# Patient Record
Sex: Male | Born: 1965 | Hispanic: No | Marital: Married | Smoking: Current every day smoker
Health system: Southern US, Community
[De-identification: ages and names within clinical notes are randomized; demographics above are authoritative.]

## PROBLEM LIST (undated history)

## (undated) DIAGNOSIS — E119 Type 2 diabetes mellitus without complications: Secondary | ICD-10-CM

## (undated) DIAGNOSIS — M1990 Unspecified osteoarthritis, unspecified site: Secondary | ICD-10-CM

## (undated) DIAGNOSIS — I4891 Unspecified atrial fibrillation: Secondary | ICD-10-CM

---

## 2015-09-18 ENCOUNTER — Emergency Department (HOSPITAL_COMMUNITY): Payer: Self-pay

## 2015-09-18 ENCOUNTER — Observation Stay (HOSPITAL_COMMUNITY)
Admission: EM | Admit: 2015-09-18 | Discharge: 2015-09-20 | Disposition: A | Discharge status: Home / Self Care | Payer: Self-pay | Attending: Internal Medicine | Admitting: Internal Medicine

## 2015-09-18 ENCOUNTER — Encounter (HOSPITAL_COMMUNITY): Payer: Self-pay | Admitting: *Deleted

## 2015-09-18 ENCOUNTER — Observation Stay (HOSPITAL_COMMUNITY): Payer: Self-pay

## 2015-09-18 DIAGNOSIS — Z6829 Body mass index (BMI) 29.0-29.9, adult: Secondary | ICD-10-CM | POA: Insufficient documentation

## 2015-09-18 DIAGNOSIS — R531 Weakness: Secondary | ICD-10-CM

## 2015-09-18 DIAGNOSIS — R4781 Slurred speech: Principal | ICD-10-CM | POA: Insufficient documentation

## 2015-09-18 DIAGNOSIS — E119 Type 2 diabetes mellitus without complications: Secondary | ICD-10-CM | POA: Insufficient documentation

## 2015-09-18 DIAGNOSIS — R2990 Unspecified symptoms and signs involving the nervous system: Secondary | ICD-10-CM | POA: Diagnosis present

## 2015-09-18 DIAGNOSIS — I639 Cerebral infarction, unspecified: Secondary | ICD-10-CM

## 2015-09-18 DIAGNOSIS — I10 Essential (primary) hypertension: Secondary | ICD-10-CM | POA: Insufficient documentation

## 2015-09-18 DIAGNOSIS — G451 Carotid artery syndrome (hemispheric): Secondary | ICD-10-CM | POA: Insufficient documentation

## 2015-09-18 DIAGNOSIS — I480 Paroxysmal atrial fibrillation: Secondary | ICD-10-CM | POA: Insufficient documentation

## 2015-09-18 DIAGNOSIS — M4802 Spinal stenosis, cervical region: Secondary | ICD-10-CM | POA: Insufficient documentation

## 2015-09-18 DIAGNOSIS — E669 Obesity, unspecified: Secondary | ICD-10-CM | POA: Insufficient documentation

## 2015-09-18 DIAGNOSIS — I4891 Unspecified atrial fibrillation: Secondary | ICD-10-CM

## 2015-09-18 DIAGNOSIS — R079 Chest pain, unspecified: Secondary | ICD-10-CM | POA: Insufficient documentation

## 2015-09-18 DIAGNOSIS — E785 Hyperlipidemia, unspecified: Secondary | ICD-10-CM | POA: Insufficient documentation

## 2015-09-18 DIAGNOSIS — R200 Anesthesia of skin: Secondary | ICD-10-CM | POA: Insufficient documentation

## 2015-09-18 DIAGNOSIS — F17210 Nicotine dependence, cigarettes, uncomplicated: Secondary | ICD-10-CM | POA: Insufficient documentation

## 2015-09-18 HISTORY — DX: Unspecified atrial fibrillation: I48.91

## 2015-09-18 LAB — DIFFERENTIAL
BASOS ABS: 0.1 10*3/uL (ref 0.0–0.1)
Basophils Relative: 1 %
Eosinophils Absolute: 0.3 10*3/uL (ref 0.0–0.7)
Eosinophils Relative: 3 %
LYMPHS ABS: 4.1 10*3/uL — AB (ref 0.7–4.0)
Lymphocytes Relative: 40 %
MONO ABS: 1.4 10*3/uL — AB (ref 0.1–1.0)
MONOS PCT: 13 %
NEUTROS ABS: 4.6 10*3/uL (ref 1.7–7.7)
Neutrophils Relative %: 43 %

## 2015-09-18 LAB — COMPREHENSIVE METABOLIC PANEL
ALK PHOS: 58 U/L (ref 38–126)
ALT: 25 U/L (ref 17–63)
AST: 24 U/L (ref 15–41)
Albumin: 3.9 g/dL (ref 3.5–5.0)
Anion gap: 9 (ref 5–15)
BILIRUBIN TOTAL: 0.7 mg/dL (ref 0.3–1.2)
BUN: 18 mg/dL (ref 6–20)
CALCIUM: 9.3 mg/dL (ref 8.9–10.3)
CO2: 23 mmol/L (ref 22–32)
CREATININE: 0.86 mg/dL (ref 0.61–1.24)
Chloride: 106 mmol/L (ref 101–111)
GFR calc Af Amer: >60 mL/min (ref >60)
Glucose, Bld: 128 mg/dL — ABNORMAL HIGH (ref 65–99)
Potassium: 4.1 mmol/L (ref 3.5–5.1)
Sodium: 138 mmol/L (ref 135–145)
TOTAL PROTEIN: 7.3 g/dL (ref 6.5–8.1)

## 2015-09-18 LAB — CBC
HEMATOCRIT: 45.2 % (ref 39.0–52.0)
HEMOGLOBIN: 14.3 g/dL (ref 13.0–17.0)
MCH: 25.3 pg — ABNORMAL LOW (ref 26.0–34.0)
MCHC: 31.6 g/dL (ref 30.0–36.0)
MCV: 79.9 fL (ref 78.0–100.0)
Platelets: 179 10*3/uL (ref 150–400)
RBC: 5.66 MIL/uL (ref 4.22–5.81)
RDW: 13.4 % (ref 11.5–15.5)
WBC: 10.4 10*3/uL (ref 4.0–10.5)

## 2015-09-18 LAB — I-STAT TROPONIN, ED: TROPONIN I, POC: 0 ng/mL (ref 0.00–0.08)

## 2015-09-18 LAB — LIPID PANEL
CHOL/HDL RATIO: 6.7 ratio
Cholesterol: 220 mg/dL — ABNORMAL HIGH (ref 0–200)
HDL: 33 mg/dL — AB (ref >40)
LDL CALC: 148 mg/dL — AB (ref 0–99)
Triglycerides: 197 mg/dL — ABNORMAL HIGH (ref <150)
VLDL: 39 mg/dL (ref 0–40)

## 2015-09-18 LAB — I-STAT CHEM 8, ED
BUN: 22 mg/dL — ABNORMAL HIGH (ref 6–20)
CALCIUM ION: 1.14 mmol/L (ref 1.12–1.23)
CREATININE: 0.8 mg/dL (ref 0.61–1.24)
Chloride: 103 mmol/L (ref 101–111)
GLUCOSE: 122 mg/dL — AB (ref 65–99)
HCT: 48 % (ref 39.0–52.0)
HEMOGLOBIN: 16.3 g/dL (ref 13.0–17.0)
POTASSIUM: 4.1 mmol/L (ref 3.5–5.1)
Sodium: 140 mmol/L (ref 135–145)
TCO2: 24 mmol/L (ref 0–100)

## 2015-09-18 LAB — APTT: APTT: 31 s (ref 24–37)

## 2015-09-18 LAB — PROTIME-INR
INR: 1.02 (ref 0.00–1.49)
Prothrombin Time: 13.6 seconds (ref 11.6–15.2)

## 2015-09-18 MED ORDER — FAMOTIDINE 20 MG PO TABS
40.0000 mg | ORAL_TABLET | Freq: Once | ORAL | Status: AC
  Administered 2015-09-18: 40 mg via ORAL
  Filled 2015-09-18: qty 2

## 2015-09-18 MED ORDER — ENOXAPARIN SODIUM 40 MG/0.4ML ~~LOC~~ SOLN
40.0000 mg | SUBCUTANEOUS | Status: DC
  Administered 2015-09-18: 40 mg via SUBCUTANEOUS
  Filled 2015-09-18: qty 0.4

## 2015-09-18 MED ORDER — ROSUVASTATIN CALCIUM 20 MG PO TABS
20.0000 mg | ORAL_TABLET | Freq: Every day | ORAL | Status: DC
  Administered 2015-09-18 – 2015-09-19 (×2): 20 mg via ORAL
  Filled 2015-09-18 (×4): qty 1

## 2015-09-18 MED ORDER — SODIUM CHLORIDE 0.9 % IV BOLUS (SEPSIS)
500.0000 mL | Freq: Once | INTRAVENOUS | Status: AC
  Administered 2015-09-18: 500 mL via INTRAVENOUS

## 2015-09-18 MED ORDER — ACETAMINOPHEN 650 MG RE SUPP: 650.0000 mg | Freq: Four times a day (QID) | RECTAL | Status: DC | PRN

## 2015-09-18 MED ORDER — STROKE: EARLY STAGES OF RECOVERY BOOK
Freq: Once | Status: AC
  Administered 2015-09-18: 17:00:00
  Filled 2015-09-18: qty 1

## 2015-09-18 MED ORDER — ACETAMINOPHEN 325 MG PO TABS: 650.0000 mg | ORAL_TABLET | Freq: Four times a day (QID) | ORAL | Status: DC | PRN

## 2015-09-18 MED ORDER — IOPAMIDOL (ISOVUE-370) INJECTION 76%
INTRAVENOUS | Status: AC
  Administered 2015-09-18: 80 mL
  Filled 2015-09-18: qty 100

## 2015-09-18 MED ORDER — SODIUM CHLORIDE 0.9 % IV SOLN
INTRAVENOUS | Status: DC
  Administered 2015-09-18: 17:00:00 via INTRAVENOUS
  Administered 2015-09-19: 1000 mL via INTRAVENOUS

## 2015-09-18 MED ORDER — HYDROCODONE-ACETAMINOPHEN 5-325 MG PO TABS: 1.0000 | ORAL_TABLET | ORAL | Status: DC | PRN

## 2015-09-18 MED ORDER — SODIUM CHLORIDE 0.9 % IV SOLN: INTRAVENOUS | Status: DC

## 2015-09-18 MED ORDER — SODIUM CHLORIDE 0.9% FLUSH
3.0000 mL | Freq: Two times a day (BID) | INTRAVENOUS | Status: DC
  Administered 2015-09-18 – 2015-09-19 (×2): 3 mL via INTRAVENOUS

## 2015-09-18 MED ORDER — DIPHENHYDRAMINE HCL 25 MG PO CAPS
50.0000 mg | ORAL_CAPSULE | Freq: Four times a day (QID) | ORAL | Status: DC | PRN
  Administered 2015-09-18: 50 mg via ORAL
  Filled 2015-09-18: qty 2

## 2015-09-18 MED ORDER — METHYLPREDNISOLONE SODIUM SUCC 125 MG IJ SOLR
125.0000 mg | Freq: Once | INTRAMUSCULAR | Status: AC
  Administered 2015-09-18: 125 mg via INTRAVENOUS
  Filled 2015-09-18: qty 2

## 2015-09-18 MED ORDER — ONDANSETRON HCL 4 MG/2ML IJ SOLN: 4.0000 mg | Freq: Four times a day (QID) | INTRAMUSCULAR | Status: DC | PRN

## 2015-09-18 MED ORDER — POLYETHYLENE GLYCOL 3350 17 G PO PACK: 17.0000 g | PACK | Freq: Every day | ORAL | Status: DC | PRN

## 2015-09-18 MED ORDER — ONDANSETRON HCL 4 MG PO TABS: 4.0000 mg | ORAL_TABLET | Freq: Four times a day (QID) | ORAL | Status: DC | PRN

## 2015-09-18 MED ORDER — BISACODYL 5 MG PO TBEC: 5.0000 mg | DELAYED_RELEASE_TABLET | Freq: Every day | ORAL | Status: DC | PRN

## 2015-09-18 MED ORDER — ASPIRIN EC 325 MG PO TBEC
325.0000 mg | DELAYED_RELEASE_TABLET | Freq: Once | ORAL | Status: AC
  Administered 2015-09-18: 325 mg via ORAL
  Filled 2015-09-18: qty 1

## 2015-09-18 NOTE — ED Notes (Signed)
PT here for chest pain and slurred speech.  States for past several days L shoulder/chest pressure and irregular HR.  When he woke up this am he felt his L arm was heavy and that his speech was slurred.  States speech improved, however feels his tongue is "fat".

## 2015-09-18 NOTE — ED Notes (Signed)
Report given to 5M RN.  

## 2015-09-18 NOTE — Consult Note (Signed)
Requesting Physician: Dr.  Adela Lank      Reason for consultation:  Acute stroke  HPI:                                                                                                                                         Dustin Long is an 50 y.o. male patient who  was brought into the emergency room for further evaluation of palpitations, left hand weakness and numbness in both upper extremities worse on the right side. He was noted to have atrial fibrillation in the ER. Neurology is consulted for further evaluation of possible acute stroke based on his symptoms.. Denies any vision problems, reported that his speech is been slightly thick. He woke up with these symptoms.   Date last known well:  2017, April  Time last known well:  Last night 10 pm tPA Given: No: outside window  Stroke Risk Factors - atrial fibrillation  Past Medical History: History reviewed. No pertinent past medical history.  History reviewed. No pertinent past surgical history.  Family History: No family history on file.  Social History:   reports that he has been smoking Cigarettes.  He has been smoking about 0.25 packs per day. He does not have any smokeless tobacco history on file. He reports that he drinks alcohol. He reports that he does not use illicit drugs.  Allergies:  No Known Allergies   Medications:                                                                                                                         Current facility-administered medications:  .  iopamidol (ISOVUE-370) 76 % injection, , , ,   Current outpatient prescriptions:  Marland Kitchen  Multiple Vitamins-Minerals (MULTIVITAMIN WITH MINERALS) tablet, Take 1 tablet by mouth daily., Disp: , Rfl:    ROS:  History obtained from the patient  General ROS: negative for - chills, fatigue, fever, night sweats,  weight gain or weight loss Psychological ROS: negative for - behavioral disorder, hallucinations, memory difficulties, mood swings or suicidal ideation Ophthalmic ROS: negative for - blurry vision, double vision, eye pain or loss of vision ENT ROS: negative for - epistaxis, nasal discharge, oral lesions, sore throat, tinnitus or vertigo Allergy and Immunology ROS: negative for - hives or itchy/watery eyes Hematological and Lymphatic ROS: negative for - bleeding problems, bruising or swollen lymph nodes Endocrine ROS: negative for - galactorrhea, hair pattern changes, polydipsia/polyuria or temperature intolerance Respiratory ROS: negative for - cough, hemoptysis, shortness of breath or wheezing Cardiovascular ROS: negative for - chest pain, dyspnea on exertion, edema or irregular heartbeat Gastrointestinal ROS: negative for - abdominal pain, diarrhea, hematemesis, nausea/vomiting or stool incontinence Genito-Urinary ROS: negative for - dysuria, hematuria, incontinence or urinary frequency/urgency Musculoskeletal ROS: negative for - joint swelling or muscular weakness Neurological ROS: as noted in HPI Dermatological ROS: negative for rash and skin lesion changes  Neurologic Examination:                                                                                                    Today's Vitals   09/18/15 0958 09/18/15 1107 09/18/15 1123 09/18/15 1232  BP:   125/82 110/87  Pulse:   93 79  Temp:  98.3 F (36.8 C)    TempSrc:      Resp:   20 15  Height:  (1.88 m)     Weight: 102.059 kg (225 lb)     SpO2:   97% 98%  PainSc:        Evaluation of higher integrative functions including: Level of alertness: Alert,  Oriented to time, place and person Recent and remote memory - intact   Attention span and concentration  - intact   Speech: fluent, no evidence of dysarthria or aphasia noted.  Test the following cranial nerves: 2-12 grossly intact Motor examination: Normal tone,  bulk, full 5/5 motor strength in all 4 extremities Examination of sensation : reduced Sensation to pinprick in the right upper and lower extremities compared to left side    Examination of deep tendon reflexes: 2+, normal and symmetric in all extremities, normal plantars bilaterally Test coordination: Normal finger nose testing, with no evidence of limb appendicular ataxia or abnormal involuntary movements or tremors noted.  Gait:Within normal limits    Lab Results: Basic Metabolic Panel:  Recent Labs Lab 09/18/15 1013 09/18/15 1017  NA 138 140  K 4.1 4.1  CL 106 103  CO2 23  --   GLUCOSE 128* 122*  BUN 18 22*  CREATININE 0.86 0.80  CALCIUM 9.3  --     Liver Function Tests:  Recent Labs Lab 09/18/15 1013  AST 24  ALT 25  ALKPHOS 58  BILITOT 0.7  PROT 7.3  ALBUMIN 3.9   No results for input(s): LIPASE, AMYLASE in the last 168 hours. No results for input(s): AMMONIA in the last 168 hours.  CBC:  Recent Labs Lab 09/18/15 1013  09/18/15 1017  WBC 10.4  --   NEUTROABS 4.6  --   HGB 14.3 16.3  HCT 45.2 48.0  MCV 79.9  --   PLT 179  --     Cardiac Enzymes: No results for input(s): CKTOTAL, CKMB, CKMBINDEX, TROPONINI in the last 168 hours.  Lipid Panel: No results for input(s): CHOL, TRIG, HDL, CHOLHDL, VLDL, LDLCALC in the last 168 hours.  CBG: No results for input(s): GLUCAP in the last 168 hours.  Microbiology: No results found for this or any previous visit.   Imaging: Ct Head Wo Contrast  09/18/2015  CLINICAL DATA:  Slurred speech and left arm numbness EXAM: CT HEAD WITHOUT CONTRAST TECHNIQUE: Contiguous axial images were obtained from the base of the skull through the vertex without intravenous contrast. COMPARISON:  None. FINDINGS: The ventricles are normal in size and configuration. The right lateral ventricle is slightly larger than the left lateral ventricle, an anatomic variant. There is no intracranial mass, hemorrhage, extra-axial fluid  collection, or midline shift. The gray-white compartments are normal. No acute infarct evident. The bony calvarium appears intact. The mastoid air cells are clear. No intraorbital lesions are evident. IMPRESSION: Study within normal limits. Electronically Signed   By: Bretta BangWilliam  Woodruff III M.D.   On: 09/18/2015 11:03     Stroke Scales   NIHSS :  3  [place] Stroke Program Early CT score (ASPECTS) : 10  Pre stroke Modified Rankin Scale (mRS): 0   Assessment and plan:   Renda RollsMohammad Long is an 50 y.o. male patient who presented For further evaluation of new onset of mild slurred speech, left hand weakness and right-sided numbness symptoms. He is noted to have a new onset of atrial fibrillation should in the ER, which is a significant risk factors for embolic strokes. Neurodiagnostic workup in the ER including CT of the head, CT angiogram of the head and neck and MRI of the brain have all been negative for any acute pathology. Possible transient ischemic event or TIA.  Started on aspirin 325 mg. Because of his atrial fibrillation, he would benefit from starting anticoagulation. Defer further discussion about anticoagulation and to initiate the therapy to the neurology stroke team will be following starting tomorrow morning. Further workup with echocardiogram, and will continue telemetry monitoring overnight in the hospital.  Physical, occupational therapy and speech pathology evaluation prior to discharge. Answered several of the patient and family's questions to their satisfaction.  Neurology stroke team will continue to follow-up starting tomorrow morning.

## 2015-09-18 NOTE — ED Notes (Signed)
Pt c/o minor itching after ct scan. Meds ordered  and given

## 2015-09-18 NOTE — Consult Note (Signed)
CARDIOLOGY CONSULT NOTE   Patient ID: Dustin Long MRN: [id number] DOB/AGE: 07-13-1965 50 y.o.  Admit date: 09/18/2015  Primary Physician   No primary care provider on file. Primary Cardiologist   New  Reason for Consultation   Atrial fib  [id number] Ketchum is a 50 y.o. year old male with a history of occasional headaches and tobacco use. He has no known history of HTN, HLD, DM.  He was admitted with possible CVA and atrial fib, cards asked to evaluate. He is able to answer questions pretty well but sometimes he seems confused.   He has a long but very infrequent history of palpitations. His heart will start skipping and racing. He will drink a glass of water and it will stop. This has been happening infrequently for years.   Over the last week or 2, it has happened more often. The episodes were still brief, until today.  Today (several days ago), the skipping and racing started without provocation. He felt a squeezing in his chest and the symptoms went up into his neck. He developed slurred speech and numbness in both arms.  These symptoms had never happened before. He came to the ER when symptoms did not resolve. In the ER, he was in atrial flutter/fib with a HR of 109.   His speech has improved, the chest pain and neck discomfort are gone, and he no longer feels his heart racing or skipping, but the numbness is still there.     He is not sure when he last had a physical. He takes lemon water 2 x week, ginger water 2 x week and cinnamon/clove water as well to keep himself healthy. He lived in [place] for a while and tends to use herbal remedies for ailments. If he feels bad, he will drink a glass of water. He drinks 1 or 2 cups of coffee daily, rare alcohol, smokes 5 or so cigarettes daily. He tries to eat healthy.  Past Medical History  Diagnosis Date  . Atrial fibrillation (HCC) 09/18/2015     History reviewed. No pertinent past surgical history.  No Known Allergies  I  have reviewed the patient's current medications .  stroke: mapping our early stages of recovery book   Does not apply Once  . enoxaparin (LOVENOX) injection  40 mg Subcutaneous Q24H  . rosuvastatin  20 mg Oral q1800  . sodium chloride flush  3 mL Intravenous Q12H   . sodium chloride    . sodium chloride     acetaminophen **OR** acetaminophen, bisacodyl, diphenhydrAMINE, HYDROcodone-acetaminophen, ondansetron **OR** ondansetron (ZOFRAN) IV, polyethylene glycol  Prior to Admission medications   Medication Sig Start Date End Date Taking? Authorizing Provider  Multiple Vitamins-Minerals (MULTIVITAMIN WITH MINERALS) tablet Take 1 tablet by mouth daily.   Yes Historical Provider, MD     Social History   Social History  . Marital Status: Married    Spouse Name: N/A  . Number of Children: N/A  . Years of Education: N/A   Occupational History  . 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 Smoking status: Current Every Day Smoker -- 0.25 packs/day    Types: Cigarettes  . Smokeless tobacco: Not on file  . Alcohol Use: Yes     Comment: occ  . Drug Use: No  . Sexual Activity: Not on file   Other Topics Concern  . Not on file   Social History Narrative   Lives with his wife  Family Status  Relation Status Death Age  . Mother Deceased   . Father Deceased   . Sister Deceased   . Brother Deceased    Family History  Problem Relation Age of Onset  . Heart attack Neg Hx   . Coronary artery disease Neg Hx      ROS:  Full 14 point review of systems complete and found to be negative unless listed above.  Physical Exam: Blood pressure 111/74, pulse 68, temperature 98.3 F (36.8 C), temperature source Oral, resp. rate 13, height 6\' 2"  (1.88 m), weight 225 lb (102.059 kg), SpO2 99 %.  General: Well developed, well nourished, male in no acute distress Head: Eyes PERRLA, No xanthomas.   Normocephalic and atraumatic, oropharynx without edema or exudate. Dentition:  fair Lungs: clear bilaterally Heart: HRRR S1 S2, no rub/gallop, no murmur. pulses are 2+ all 4 extrem.   Neck: No carotid bruits. No lymphadenopathy.  JVD not elevated. Abdomen: Bowel sounds present, abdomen soft and non-tender without masses or hernias noted. Msk:  No spine or cva tenderness. No weakness, no joint deformities or effusions. Extremities: No clubbing or cyanosis. No edema.  Neuro: Alert and oriented X 3. No focal deficits noted. Psych:  Good affect, responds appropriately Skin: No rashes or lesions noted.  Labs:   Lab Results  Component Value Date   WBC 10.4 09/18/2015   HGB 16.3 09/18/2015   HCT 48.0 09/18/2015   MCV 79.9 09/18/2015   PLT 179 09/18/2015    Recent Labs  09/18/15 1013  INR 1.02     Recent Labs Lab 09/18/15 1013 09/18/15 1017  NA 138 140  K 4.1 4.1  CL 106 103  CO2 23  --   BUN 18 22*  CREATININE 0.86 0.80  CALCIUM 9.3  --   PROT 7.3  --   BILITOT 0.7  --   ALKPHOS 58  --   ALT 25  --   AST 24  --   GLUCOSE 128* 122*  ALBUMIN 3.9  --     Recent Labs  09/18/15 1015  TROPIPOC 0.00   Lab Results  Component Value Date   CHOL 220* 09/18/2015   HDL 33* 09/18/2015   LDLCALC 148* 09/18/2015   TRIG 197* 09/18/2015   ECG:  04/21 Atrial flutter vs fib, HR 109  Radiology:  Ct Angio Head and neck W/cm &/or Wo Cm 09/18/2015  CLINICAL DATA:  Left face numbness, slurred speech, decreased grip in left hand. EXAM: CT ANGIOGRAPHY HEAD AND NECK TECHNIQUE: Multidetector CT imaging of the head and neck was performed using the standard protocol during bolus administration of intravenous contrast. Multiplanar CT image reconstructions and MIPs were obtained to evaluate the vascular anatomy. Carotid stenosis measurements (when applicable) are obtained utilizing NASCET criteria, using the distal internal carotid diameter as the denominator. CONTRAST:  80 cc Isovue 370 intravenous COMPARISON:  Head CT from earlier today FINDINGS: CTA NECK Aortic arch:  No aneurysm or dissection.  Three vessel branching Right carotid system: No atheromatous changes. No stenosis, dissection, or vessel beading Left carotid system: No atheromatous change. No stenosis, dissection, or vessel beading. Vertebral arteries:No proximal subclavian stenosis. Codominant vertebral arteries. The right V1 segment is partially obscured by intravenous contrast. Both vertebral arteries are smooth widely patent to the dura . Skeleton: No contributory finding. Multilevel disc degeneration with reversed cervical lordosis. Other neck: Atherosclerotic plaque seen in the distal LAD. CTA HEAD Anterior circulation: Fenestrated anterior communicating artery. Probable small posterior communicating arteries. No  major branch occlusion or stenosis. No aneurysm or beading. Posterior circulation: Symmetric vertebral arteries and vertebrobasilar branching. Both PICAs originates near the dural penetration. No major branch occlusion or stenosis. No aneurysm or beading. Venous sinuses: Patent Anatomic variants: Fenestrated anterior communicating artery Delayed phase: No parenchymal enhancement or mass lesion. IMPRESSION: Negative CTA.  No major branch occlusion or stenosis. Electronically Signed   By: Marnee Spring M.D.   On: 09/18/2015 13:52   Ct Head Wo Contrast 09/18/2015  CLINICAL DATA:  Slurred speech and left arm numbness EXAM: CT HEAD WITHOUT CONTRAST TECHNIQUE: Contiguous axial images were obtained from the base of the skull through the vertex without intravenous contrast. COMPARISON:  None. FINDINGS: The ventricles are normal in size and configuration. The right lateral ventricle is slightly larger than the left lateral ventricle, an anatomic variant. There is no intracranial mass, hemorrhage, extra-axial fluid collection, or midline shift. The gray-white compartments are normal. No acute infarct evident. The bony calvarium appears intact. The mastoid air cells are clear. No intraorbital lesions are  evident. IMPRESSION: Study within normal limits. Electronically Signed   By: Bretta Bang III M.D.   On: 09/18/2015 11:03   Mr Brain Wo Contrast 09/18/2015  CLINICAL DATA:  50 year old male with slurred speech and left upper extremity numbness. Initial encounter. EXAM: MRI HEAD WITHOUT CONTRAST TECHNIQUE: Multiplanar, multiecho pulse sequences of the brain and surrounding structures were obtained without intravenous contrast. COMPARISON:  Head CT without contrast 1055 hours today. CTA head and neck 1309 hours. FINDINGS: Study is mildly degraded by motion artifact despite repeated imaging attempts. No convincing restricted diffusion to suggest acute infarct. Major intracranial vascular flow voids are preserved. No midline shift, mass effect, evidence of mass lesion, ventriculomegaly, extra-axial collection or acute intracranial hemorrhage. Cervicomedullary junction and pituitary are within normal limits. Patchy cerebral white matter T2 and FLAIR hyperintensity in the anterior right frontal lobe near the frontal horn. Mild periventricular T2 and FLAIR hyperintensity along the body of the right lateral ventricle (series 7, image 18). These are nonspecific. No cortical encephalomalacia or chronic cerebral blood products identified. Elsewhere gray and white matter signal is within normal limits. Mastoids are clear. Trace paranasal sinus mucosal thickening. Negative orbit and scalp soft tissues. Chronic C4-C5 disc and endplate degeneration. Normal bone marrow signal. IMPRESSION: 1.  No acute intracranial abnormality. 2. Mild for age nonspecific signal changes in the right frontal lobe white matter. Electronically Signed   By: Odessa Fleming M.D.   On: 09/18/2015 15:09    ASSESSMENT AND PLAN:   The patient was seen today by Dr Excell Seltzer, the patient evaluated and the data reviewed.   Principal Problem:   Stroke-like symptoms - per IM  Active Problems:   Atrial fibrillation (HCC) - unknown duration - CHADS2VASC=2  (TIA) - currently is on DVT Lovenox - MD advise on full anticoagulation either with oral agent or heparin.  - no insurance, may qualify for pt assistance with Eliquis or Xarelto  SignedLeanna Battles 09/18/2015 4:06 PM Beeper [phone]  Co-Sign MD  Patient seen, examined. Available data reviewed. Agree with findings, assessment, and plan as outlined by Theodore Demark, PA-C. Exam reveals alert oriented male in NAD. Lungs CTA, heart RRR without murmur, abdomen soft, obese, extremities without edema.   Tele reviewed and atrial fib was present initially, now spontaneously back in sinus rhythm.   CHADS-Vasc = 2 (if TIA confirmed by neuro), maybe 3 (hx diabetes but he states he was 'cured' with acupuncture. Probably best to anticoagulate  with CHADS-Vasc 2/3. Discussed options with patient and he is agreeable to NOAC drug. Will start on Xarelto. Recommend check 2D Echo and do 48 hour Holter at discharge. He has 'slow heart rate' at home and doesn't want to take beta-blocker so will monitor without AV nodal blocker for now. FU in atrial fib clinic.  Tonny Bollman, M.D. 09/18/2015 7:24 PM

## 2015-09-18 NOTE — Progress Notes (Signed)
Patient admitted to the unit from ER. Patient alert and oriented x 4. Patient oriented to room and made comfortable. Tele placed on patient.

## 2015-09-18 NOTE — H&P (Signed)
History and Physical    Dustin RollsMohammad Long EAV:[id number] DOB: 03/26/1966 DOA: 09/18/2015  Referring MD/NP/PA: Ivar Drapeob Browning, MD - EDP PCP: No primary care provider on file.  Outpatient Specialists: none Patient coming from:  home  Chief Complaint: slurred speech, chest pain, left arm numbness and irregular heart beat.    HPI: Dustin RollsMohammad Long is a 50 y.o. male with no significant medical history who presented to ED this am with slurred speech. For a week patient has been having intermittent irregular heartbeat associated with chest discomfort . Symptoms worse with exertion . Patient complains of bilateral upper extremity numbness associated with discomfort when making a fists. Around 7 AM today patient woke with slurred speech. Currently patient is a little dizzy, the bilateral upper extremity numbness is better but unresolved. Slurred speech has resolved  ED Course: no significant lab abnormalities. No acute abnormalities on head and neck CTA. EDP contacted Neurology - Dr. Lavon PaganiniNandigam who will see the patient  Review of Systems: As per HPI otherwise 10 point review of systems negative.   PMH: none  PSH: none  FMH: no history of CVA, DM or other illness. Parents relatively health.    reports that he has been smoking Cigarettes.  He has been smoking about 0.25 packs per day. He does not have any smokeless tobacco history on file. He reports that he drinks alcohol. He reports that he does not use illicit drugs.  No Known Allergies   Prior to Admission medications   Medication Sig Start Date End Date Taking? Authorizing Provider  Multiple Vitamins-Minerals (MULTIVITAMIN WITH MINERALS) tablet Take 1 tablet by mouth daily.   Yes Historical Provider, MD    Physical Exam: Filed Vitals:   09/18/15 1200 09/18/15 1230 09/18/15 1232 09/18/15 1342  BP: 122/79 110/87 110/87 102/73  Pulse: 73 67 79 72  Temp:      TempSrc:      Resp: 12 14 15 20   Height:      Weight:      SpO2: 99% 98% 98% 99%      Constitutional: NAD, calm, comfortable Filed Vitals:   09/18/15 1200 09/18/15 1230 09/18/15 1232 09/18/15 1342  BP: 122/79 110/87 110/87 102/73  Pulse: 73 67 79 72  Temp:      TempSrc:      Resp: 12 14 15 20   Height:      Weight:      SpO2: 99% 98% 98% 99%   Eyes: PERRL, lids and conjunctivae normal ENMT: Mucous membranes are moist. Posterior pharynx clear of any exudate or lesions..  Neck: normal, supple, no masses Respiratory: clear to auscultation bilaterally, no wheezing, no crackles. Normal respiratory effort. No accessory muscle use.  Cardiovascular: Regular rate and rhythm, no murmurs / rubs / gallops. No extremity edema. 2+ pedal pulses. No carotid bruits.  Abdomen: no tenderness, no masses palpated. No hepatosplenomegaly. Bowel sounds positive.  Musculoskeletal: no clubbing / cyanosis. No joint deformity upper and lower extremities. Good ROM, no contractures. Normal muscle tone.  Skin: no rashes, lesions, ulcers. No induration Neurologic: CN 2-12 grossly intact. Sensation intact, DTR normal. Strength 5/5 in all 4.  Psychiatric: Normal judgment and insight. Alert and oriented x 3. Normal mood.   Labs on Admission: I have personally reviewed following labs and imaging studies  CBC:  Recent Labs Lab 09/18/15 1013 09/18/15 1017  WBC 10.4  --   NEUTROABS 4.6  --   HGB 14.3 16.3  HCT 45.2 48.0  MCV 79.9  --   PLT  179  --    Basic Metabolic Panel:  Recent Labs Lab 09/18/15 1013 09/18/15 1017  NA 138 140  K 4.1 4.1  CL 106 103  CO2 23  --   GLUCOSE 128* 122*  BUN 18 22*  CREATININE 0.86 0.80  CALCIUM 9.3  --    GFR: Estimated Creatinine Clearance: 140.9 mL/min (by C-G formula based on Cr of 0.8). Liver Function Tests:  Recent Labs Lab 09/18/15 1013  AST 24  ALT 25  ALKPHOS 58  BILITOT 0.7  PROT 7.3  ALBUMIN 3.9   Coagulation Profile:  Recent Labs Lab 09/18/15 1013  INR 1.02   Radiological Exams on Admission: Ct Angio Head W/cm &/or  Wo Cm  09/18/2015  CLINICAL DATA:  Left face numbness, slurred speech, decreased grip in left hand. EXAM: CT ANGIOGRAPHY HEAD AND NECK TECHNIQUE: Multidetector CT imaging of the head and neck was performed using the standard protocol during bolus administration of intravenous contrast. Multiplanar CT image reconstructions and MIPs were obtained to evaluate the vascular anatomy. Carotid stenosis measurements (when applicable) are obtained utilizing NASCET criteria, using the distal internal carotid diameter as the denominator. CONTRAST:  80 cc Isovue 370 intravenous COMPARISON:  Head CT from earlier today FINDINGS: CTA NECK Aortic arch: No aneurysm or dissection.  Three vessel branching Right carotid system: No atheromatous changes. No stenosis, dissection, or vessel beading Left carotid system: No atheromatous change. No stenosis, dissection, or vessel beading. Vertebral arteries:No proximal subclavian stenosis. Codominant vertebral arteries. The right V1 segment is partially obscured by intravenous contrast. Both vertebral arteries are smooth widely patent to the dura . Skeleton: No contributory finding. Multilevel disc degeneration with reversed cervical lordosis. Other neck: Atherosclerotic plaque seen in the distal LAD. CTA HEAD Anterior circulation: Fenestrated anterior communicating artery. Probable small posterior communicating arteries. No major branch occlusion or stenosis. No aneurysm or beading. Posterior circulation: Symmetric vertebral arteries and vertebrobasilar branching. Both PICAs originates near the dural penetration. No major branch occlusion or stenosis. No aneurysm or beading. Venous sinuses: Patent Anatomic variants: Fenestrated anterior communicating artery Delayed phase: No parenchymal enhancement or mass lesion. IMPRESSION: Negative CTA.  No major branch occlusion or stenosis. Electronically Signed   By: Marnee Spring M.D.   On: 09/18/2015 13:52   Ct Head Wo Contrast  09/18/2015   CLINICAL DATA:  Slurred speech and left arm numbness EXAM: CT HEAD WITHOUT CONTRAST TECHNIQUE: Contiguous axial images were obtained from the base of the skull through the vertex without intravenous contrast. COMPARISON:  None. FINDINGS: The ventricles are normal in size and configuration. The right lateral ventricle is slightly larger than the left lateral ventricle, an anatomic variant. There is no intracranial mass, hemorrhage, extra-axial fluid collection, or midline shift. The gray-white compartments are normal. No acute infarct evident. The bony calvarium appears intact. The mastoid air cells are clear. No intraorbital lesions are evident. IMPRESSION: Study within normal limits. Electronically Signed   By: Bretta Bang III M.D.   On: 09/18/2015 11:03   Ct Angio Neck W/cm &/or Wo/cm  09/18/2015  CLINICAL DATA:  Left face numbness, slurred speech, decreased grip in left hand. EXAM: CT ANGIOGRAPHY HEAD AND NECK TECHNIQUE: Multidetector CT imaging of the head and neck was performed using the standard protocol during bolus administration of intravenous contrast. Multiplanar CT image reconstructions and MIPs were obtained to evaluate the vascular anatomy. Carotid stenosis measurements (when applicable) are obtained utilizing NASCET criteria, using the distal internal carotid diameter as the denominator. CONTRAST:  80 cc  Isovue 370 intravenous COMPARISON:  Head CT from earlier today FINDINGS: CTA NECK Aortic arch: No aneurysm or dissection.  Three vessel branching Right carotid system: No atheromatous changes. No stenosis, dissection, or vessel beading Left carotid system: No atheromatous change. No stenosis, dissection, or vessel beading. Vertebral arteries:No proximal subclavian stenosis. Codominant vertebral arteries. The right V1 segment is partially obscured by intravenous contrast. Both vertebral arteries are smooth widely patent to the dura . Skeleton: No contributory finding. Multilevel disc  degeneration with reversed cervical lordosis. Other neck: Atherosclerotic plaque seen in the distal LAD. CTA HEAD Anterior circulation: Fenestrated anterior communicating artery. Probable small posterior communicating arteries. No major branch occlusion or stenosis. No aneurysm or beading. Posterior circulation: Symmetric vertebral arteries and vertebrobasilar branching. Both PICAs originates near the dural penetration. No major branch occlusion or stenosis. No aneurysm or beading. Venous sinuses: Patent Anatomic variants: Fenestrated anterior communicating artery Delayed phase: No parenchymal enhancement or mass lesion. IMPRESSION: Negative CTA.  No major branch occlusion or stenosis. Electronically Signed   By: Marnee Spring M.D.   On: 09/18/2015 13:52    EKG: Independently reviewed.  EKG Interpretation  Date/Time:  Friday September 18 2015 09:52:02 EDT Ventricular Rate:  109 PR Interval:    QRS Duration: 76 QT Interval:  326 QTC Calculation: 439 R Axis:   90 Text Interpretation:  Atrial fibrillation with rapid ventricular response Rightward axis Abnormal ECG No old tracing to compare Confirmed by FLOYD MD, DANIEL [phone]) on 09/18/2015 9:58:46 AM Also confirmed by Adela Lank MD, DANIEL [phone]), editor [place] CT, Jola Babinski [phone])  on 09/18/2015 10:37:08 AM        Assessment/Plan   Stroke-like symptoms in setting of new onset atrial fibrillation. CT angiogram head and neck unremarkable. Slurred speech resolved. Probable TIA. Neurology evaluating.  -  Place in Observation /Telemetry  -  Initially q2h neuro checks, then q4h neuro checks -  Bubble  ECHO already ordered -  Lipid panel  (done)  -  A1c -  PT/OT -  RN to perform bedside swallowing evaluation and advance diet if study normal.  -  Neurology assistance appreciated -  Antiplatelet recommendations to be made by Neurology.  -  Brain MRI ordered by Neurology  Atrial fibrillation, new onset. Chadsvasc.0. Rate 108 -monitor on  Telemetry -Cardiology consult placed for new atrial fibrillation  Tobacco abuse.  -RN to provide smoking cessation education  Hyperlipidemia.  -Start Crestor  daily    Lab Results  Component Value Date   CHOL 220* 09/18/2015   HDL 33* 09/18/2015   LDLCALC 148* 09/18/2015   TRIG 197* 09/18/2015   CHOLHDL 6.7 09/18/2015    DVT prophylaxis: Lovenox Code Status:  Full code Family Communication: Discussed with the patient Disposition Plan: home in 24-48 hours Consults called: Neurology - Dr. Lavon Paganini Admission status:  Observation / telemetry   Willette Cluster MD [facility] Pager [phone]  If 7PM-7AM, please contact night-coverage [email or web address] Password [facility]  09/18/2015, 2:04 PM

## 2015-09-18 NOTE — ED Notes (Signed)
Dr. Nandigm at bedside.  

## 2015-09-18 NOTE — ED Provider Notes (Signed)
CSN: [id number]     Arrival date & time 09/18/15  0944 History   First MD Initiated Contact with Patient 09/18/15 1002     Chief Complaint  Patient presents with  . Aphasia  . Chest Pain     (Consider location/radiation/quality/duration/timing/severity/associated sxs/prior Treatment) HPI Comments: Patient presents to the emergency department with chief complaint of slurred speech and left arm numbness sensation. He states that the symptoms were present when he awoke this morning. Last normal night before bed. Patient also reports that he has had some heart palpitations. He has never felt this before in the past. He has no medical problems. He does not take any medications. Reports mild improvement of slurred speech, but still has heaviness sensation in left arm.  The history is provided by the patient. No language interpreter was used.    History reviewed. No pertinent past medical history. History reviewed. No pertinent past surgical history. No family history on file. Social History  Substance Use Topics  . Smoking status: Current Every Day Smoker -- 0.25 packs/day    Types: Cigarettes  . Smokeless tobacco: None  . Alcohol Use: Yes     Comment: occ    Review of Systems  Constitutional: Negative for fever and chills.  Respiratory: Negative for shortness of breath.   Cardiovascular: Positive for palpitations. Negative for chest pain.  Gastrointestinal: Negative for nausea, vomiting, diarrhea and constipation.  Genitourinary: Negative for dysuria.  Neurological: Positive for speech difficulty.  All other systems reviewed and are negative.     Allergies  Review of patient's allergies indicates no known allergies.  Home Medications   Prior to Admission medications   Not on File   BP 159/87 mmHg  Pulse 73  Temp(Src) 98.3 F (36.8 C) (Oral)  Resp 14  Ht  (1.88 m)  Wt 102.059 kg  BMI 28.88 kg/m2  SpO2 98% Physical Exam  Constitutional: He is oriented to  person, place, and time. He appears well-developed and well-nourished.  HENT:  Head: Normocephalic and atraumatic.  Eyes: Conjunctivae and EOM are normal. Pupils are equal, round, and reactive to light. Right eye exhibits no discharge. Left eye exhibits no discharge. No scleral icterus.  Neck: Normal range of motion. Neck supple. No JVD present.  Cardiovascular: Regular rhythm and normal heart sounds.  Exam reveals no gallop and no friction rub.   No murmur heard. Irregularly irregular, mildly tachycardic  Pulmonary/Chest: Effort normal and breath sounds normal. No respiratory distress. He has no wheezes. He has no rales. He exhibits no tenderness.  Abdominal: Soft. He exhibits no distension and no mass. There is no tenderness. There is no rebound and no guarding.  Musculoskeletal: Normal range of motion. He exhibits no edema or tenderness.  CN 3-12 intact Speech seems clear now Sensation and strength 5/5 objectively throughout Normal finger to nose No pronator drift  Neurological: He is alert and oriented to person, place, and time.  Skin: Skin is warm and dry.  Psychiatric: He has a normal mood and affect. His behavior is normal. Judgment and thought content normal.  Nursing note and vitals reviewed.   ED Course  Procedures (including critical care time) Labs Review Labs Reviewed  CBC - Abnormal; Notable for the following:    MCH 25.3 (*)    All other components within normal limits  DIFFERENTIAL - Abnormal; Notable for the following:    Lymphs Abs 4.1 (*)    Monocytes Absolute 1.4 (*)    All other components within normal  limits  I-STAT CHEM 8, ED - Abnormal; Notable for the following:    BUN 22 (*)    Glucose, Bld 122 (*)    All other components within normal limits  PROTIME-INR  APTT  COMPREHENSIVE METABOLIC PANEL  I-STAT TROPOININ, ED  CBG MONITORING, ED    Imaging Review No results found. I have personally reviewed and evaluated these images and lab results as  part of my medical decision-making.   EKG Interpretation   Date/Time:  Friday September 18 2015 09:52:02 EDT Ventricular Rate:  109 PR Interval:    QRS Duration: 76 QT Interval:  326 QTC Calculation: 439 R Axis:   90 Text Interpretation:  Atrial fibrillation with rapid ventricular response  Rightward axis Abnormal ECG No old tracing to compare Confirmed by FLOYD  MD, DANIEL [phone]) on 09/18/2015 9:58:46 AM      MDM   Final diagnoses:  Slurred speech  New onset a-fib (HCC)  Stroke-like symptoms    Patient with stroke like symptoms and new onset afib.  Currently rate controlled.  Discussed with Dr. Adela LankFloyd, who agrees with plan for stroke workup and admission.  Currently neurovascular intact.  No TPA given length of symptoms.  Patient discussed with Dr. Lavon PaganiniNandigam, who recommends CT angiogram of head and cervical spine. He will consult on the patient.  12:53 PM Patient seen by and discussed with Dr. Lavon PaganiniNandigam, who agrees that patient likely had a stroke.  CTAs pending. Dispo per CTAs.  If nothing requiring IR then Hospitalist admission.  2:12 PM CT angio is negative.    Admit to hospitalist service.  Neurology will follow.  Afib is rate controlled.  Roxy Horsemanobert Brandis Matsuura, PA-C 09/18/15 1413  Melene Planan Floyd, DO 09/18/15 1414

## 2015-09-19 ENCOUNTER — Observation Stay (HOSPITAL_BASED_OUTPATIENT_CLINIC_OR_DEPARTMENT_OTHER): Payer: Self-pay

## 2015-09-19 ENCOUNTER — Observation Stay (HOSPITAL_COMMUNITY): Payer: Self-pay

## 2015-09-19 DIAGNOSIS — Z720 Tobacco use: Secondary | ICD-10-CM

## 2015-09-19 DIAGNOSIS — R2990 Unspecified symptoms and signs involving the nervous system: Secondary | ICD-10-CM

## 2015-09-19 DIAGNOSIS — G451 Carotid artery syndrome (hemispheric): Secondary | ICD-10-CM

## 2015-09-19 DIAGNOSIS — E669 Obesity, unspecified: Secondary | ICD-10-CM

## 2015-09-19 DIAGNOSIS — I6789 Other cerebrovascular disease: Secondary | ICD-10-CM

## 2015-09-19 DIAGNOSIS — M4802 Spinal stenosis, cervical region: Secondary | ICD-10-CM | POA: Insufficient documentation

## 2015-09-19 DIAGNOSIS — E785 Hyperlipidemia, unspecified: Secondary | ICD-10-CM | POA: Insufficient documentation

## 2015-09-19 LAB — CBC
HEMATOCRIT: 40.7 % (ref 39.0–52.0)
Hemoglobin: 12.7 g/dL — ABNORMAL LOW (ref 13.0–17.0)
MCH: 25 pg — ABNORMAL LOW (ref 26.0–34.0)
MCHC: 31.2 g/dL (ref 30.0–36.0)
MCV: 80.1 fL (ref 78.0–100.0)
Platelets: 194 10*3/uL (ref 150–400)
RBC: 5.08 MIL/uL (ref 4.22–5.81)
RDW: 13.4 % (ref 11.5–15.5)
WBC: 9.8 10*3/uL (ref 4.0–10.5)

## 2015-09-19 LAB — BASIC METABOLIC PANEL
Anion gap: 12 (ref 5–15)
BUN: 14 mg/dL (ref 6–20)
CALCIUM: 8.8 mg/dL — AB (ref 8.9–10.3)
CO2: 21 mmol/L — ABNORMAL LOW (ref 22–32)
Chloride: 103 mmol/L (ref 101–111)
Creatinine, Ser: 0.93 mg/dL (ref 0.61–1.24)
GFR calc Af Amer: >60 mL/min (ref >60)
GLUCOSE: 236 mg/dL — AB (ref 65–99)
POTASSIUM: 4.5 mmol/L (ref 3.5–5.1)
Sodium: 136 mmol/L (ref 135–145)

## 2015-09-19 LAB — HEMOGLOBIN A1C
HEMOGLOBIN A1C: 6.9 % — AB (ref 4.8–5.6)
MEAN PLASMA GLUCOSE: 151 mg/dL

## 2015-09-19 MED ORDER — RIVAROXABAN 20 MG PO TABS
20.0000 mg | ORAL_TABLET | Freq: Every day | ORAL | Status: DC
  Administered 2015-09-19: 20 mg via ORAL
  Filled 2015-09-19: qty 1

## 2015-09-19 NOTE — Progress Notes (Signed)
ANTICOAGULATION CONSULT NOTE - Initial Consult  Pharmacy Consult for xarelto  Indication: atrial fibrillation  No Known Allergies  Patient Measurements: Height: 6\' 2"  (188 cm) Weight: 231 lb 1.8 oz (104.831 kg) IBW/kg (Calculated) : 82.2  Vital Signs: Temp: 98.4 F (36.9 C) (04/22 1316) Temp Source: Oral (04/22 1316) BP: 117/64 mmHg (04/22 1316) Pulse Rate: 74 (04/22 1316)  Labs:  Recent Labs  09/18/15 1013 09/18/15 1017 09/19/15 0310  HGB 14.3 16.3 12.7*  HCT 45.2 48.0 40.7  PLT 179  --  194  APTT 31  --   --   LABPROT 13.6  --   --   INR 1.02  --   --   CREATININE 0.86 0.80 0.93    Estimated Creatinine Clearance: 122.6 mL/min (by C-G formula based on Cr of 0.93).   Medical History: Past Medical History  Diagnosis Date  . Atrial fibrillation (HCC) 09/18/2015    Assessment: [age] yo presenting with new onset a fib. Seems like been having symptoms for years   Was on DVT prop. Enoxaparin  Plan:  Xarelto 20 mg daily with supper D/C enoxaparin  Monitor for s/sx of bleeding  Isaac BlissMichael Raidyn Wassink, PharmD, BCPS, [facility]CCCP Clinical Pharmacist Pager [phone] 09/19/2015 2:05 PM

## 2015-09-19 NOTE — Evaluation (Signed)
Physical Therapy Evaluation Patient Details Name: Dustin RollsMohammad Edgren MRN: [id number] DOB: 10/25/1965 Today's Date: 09/19/2015   History of Present Illness  Pt is a 50 y.o. male admitted for stroke-like symptoms. He presented with slurred speech, mild dizziness and BUE pain/numbness. All symptoms have resolved except BUE pain. He has no significant medical history.  Clinical Impression  Pt is independent with all functional mobility and demo no balance deficits. Strength is symmetrical and 5/5 bilaterally. He did express concerns of his symptoms originating from the cervical spine. No further PT services indicated. PT signing off.    Follow Up Recommendations No PT follow up    Equipment Recommendations  None recommended by PT    Recommendations for Other Services       Precautions / Restrictions Precautions Precautions: None      Mobility  Bed Mobility Overal bed mobility: Independent                Transfers Overall transfer level: Independent Equipment used: None                Ambulation/Gait Ambulation/Gait assistance: Independent Ambulation Distance (Feet): 1000 Feet Assistive device: None Gait Pattern/deviations: WFL(Within Functional Limits)   Gait velocity interpretation: at or above normal speed for age/gender    Stairs            Wheelchair Mobility    Modified Rankin (Stroke Patients Only)       Balance Overall balance assessment: Independent                               Standardized Balance Assessment Standardized Balance Assessment : Dynamic Gait Index   Dynamic Gait Index Level Surface: Normal Change in Gait Speed: Normal Gait with Horizontal Head Turns: Normal Gait with Vertical Head Turns: Normal Gait and Pivot Turn: Normal Step Over Obstacle: Normal Step Around Obstacles: Normal Steps: Mild Impairment Total Score: 23       Pertinent Vitals/Pain Pain Assessment: Faces Pain Location: "My hands hurt when  I try to pick things up." Pain Intervention(s): Monitored during session    Home Living Family/patient expects to be discharged to:: Private residence Living Arrangements: Spouse/significant other Available Help at Discharge: Family;Available 24 hours/day Type of Home: Apartment Home Access: Stairs to enter Entrance Stairs-Rails: Right Entrance Stairs-Number of Steps: 4 Home Layout: One level Home Equipment: None      Prior Function Level of Independence: Independent               Hand Dominance        Extremity/Trunk Assessment   Upper Extremity Assessment: Overall WFL for tasks assessed (strength is symmetrical with sensation intact)           Lower Extremity Assessment: Overall WFL for tasks assessed      Cervical / Trunk Assessment: Normal  Communication   Communication: No difficulties  Cognition Arousal/Alertness: Awake/alert Behavior During Therapy: WFL for tasks assessed/performed Overall Cognitive Status: Within Functional Limits for tasks assessed                      General Comments      Exercises        Assessment/Plan    PT Assessment Patent does not need any further PT services  PT Diagnosis Difficulty walking;Acute pain   PT Problem List    PT Treatment Interventions     PT Goals (Current goals can be found in the  Care Plan section) Acute Rehab PT Goals Patient Stated Goal: figure out what is going on PT Goal Formulation: All assessment and education complete, DC therapy    Frequency     Barriers to discharge        Co-evaluation               End of Session   Activity Tolerance: Patient tolerated treatment well Patient left: in chair;with call bell/phone within reach Nurse Communication: Mobility status    Functional Assessment Tool Used: clinical judgement Functional Limitation: Mobility: Walking and moving around Mobility: Walking and Moving Around Current Status [phone]): 0 percent impaired, limited  or restricted Mobility: Walking and Moving Around Goal Status [phone]): 0 percent impaired, limited or restricted Mobility: Walking and Moving Around Discharge Status [phone]): 0 percent impaired, limited or restricted    Time: 9147-8295 PT Time Calculation (min) (ACUTE ONLY): 21 min   Charges:   PT Evaluation $PT Eval Moderate Complexity: 1 Procedure     PT G Codes:   PT G-Codes **NOT FOR INPATIENT CLASS** Functional Assessment Tool Used: clinical judgement Functional Limitation: Mobility: Walking and moving around Mobility: Walking and Moving Around Current Status (A2130): 0 percent impaired, limited or restricted Mobility: Walking and Moving Around Goal Status (Q6578): 0 percent impaired, limited or restricted Mobility: Walking and Moving Around Discharge Status [phone]): 0 percent impaired, limited or restricted    Ilda Foil 09/19/2015, 12:56 PM

## 2015-09-19 NOTE — Progress Notes (Signed)
Called case manager to notify about patient's consult.

## 2015-09-19 NOTE — Progress Notes (Signed)
STROKE TEAM PROGRESS NOTE   HISTORY OF PRESENT ILLNESS Dustin Long is an 50 y.o. male patient who was brought into the emergency room for further evaluation of palpitations, left hand weakness and numbness in both upper extremities worse on the right side. He was noted to have atrial fibrillation in the ER. Neurology is consulted for further evaluation of possible acute stroke based on his symptoms.. Denies any vision problems, reported that his speech is been slightly thick. He woke up with these symptoms.   Date last known well: 2017, April  Time last known well: Last night 10 pm tPA Given: No: outside window   SUBJECTIVE (INTERVAL HISTORY) The patient's wife and daughter were at the bedside. He has been experiencing neck discomfort and occasional dizziness when turning his head. He still complains of some numbness and weakness of his b/l upper extremities, but much better than yesterday. MRI brain negative for stroke. Will do MRI of the cervical spine.   OBJECTIVE Temp:  [97.6 F (36.4 C)-98.3 F (36.8 C)] 97.6 F (36.4 C) (04/22 0400) Pulse Rate:  [59-97] 92 (04/22 0400) Cardiac Rhythm:  [-] Sinus tachycardia (04/21 1902) Resp:  [12-20] 15 (04/22 0400) BP: (102-159)/(56-87) 120/56 mmHg (04/22 0400) SpO2:  [95 %-99 %] 98 % (04/22 0400) Weight:  [102.059 kg (225 lb)-104.831 kg (231 lb 1.8 oz)] 104.831 kg (231 lb 1.8 oz) (04/21 1600)  CBC:  Recent Labs Lab 09/18/15 1013 09/18/15 1017 09/19/15 0310  WBC 10.4  --  9.8  NEUTROABS 4.6  --   --   HGB 14.3 16.3 12.7*  HCT 45.2 48.0 40.7  MCV 79.9  --  80.1  PLT 179  --  194    Basic Metabolic Panel:  Recent Labs Lab 09/18/15 1013 09/18/15 1017 09/19/15 0310  NA 138 140 136  K 4.1 4.1 4.5  CL 106 103 103  CO2 23  --  21*  GLUCOSE 128* 122* 236*  BUN 18 22* 14  CREATININE 0.86 0.80 0.93  CALCIUM 9.3  --  8.8*    Lipid Panel:    Component Value Date/Time   CHOL 220* 09/18/2015 1402   TRIG 197* 09/18/2015 1402    HDL 33* 09/18/2015 1402   CHOLHDL 6.7 09/18/2015 1402   VLDL 39 09/18/2015 1402   LDLCALC 148* 09/18/2015 1402   HgbA1c:  Lab Results  Component Value Date   HGBA1C 6.9* 09/18/2015   Urine Drug Screen: No results found for: LABOPIA, COCAINSCRNUR, LABBENZ, AMPHETMU, THCU, LABBARB    IMAGING I have personally reviewed the radiological images below and agree with the radiology interpretations.  Ct Angio Head and Neck W/cm &/or Wo Cm 09/18/2015   Negative CTA.  No major branch occlusion or stenosis.   Ct Head Wo Contrast 09/18/2015   Study within normal limits.   Mr Brain Wo Contrast 09/18/2015   1.  No acute intracranial abnormality.  2. Mild for age nonspecific signal changes in the right frontal lobe white matter.   MRI of the cervical spine 09/19/2015 1. Fairly advanced chronic cervical disc and endplate degeneration from C4-C5 to C6-C7. Spinal stenosis at each level with mild to moderate spinal cord mass effect most pronounced at C5-C6. No cord signal abnormality associated. 2. Moderate bilateral C5 and up to moderate right C6 neural foraminal stenosis. Mild bilateral C7 foraminal stenosis.  TTE - - Left ventricle: The cavity size was normal. There was moderate  concentric hypertrophy. Systolic function was vigorous. The  estimated ejection fraction was in  the range of 65% to 70%. Wall  motion was normal; there were no regional wall motion  abnormalities. Features are consistent with a pseudonormal left  ventricular filling pattern, with concomitant abnormal relaxation  and increased filling pressure (grade 2 diastolic dysfunction).  There was no evidence of elevated ventricular filling pressure by  Doppler parameters. - Aortic valve: Structurally normal valve. There was no  regurgitation. - Mitral valve: Structurally normal valve. There was no  regurgitation. - Left atrium: The atrium was mildly dilated. - Right ventricle: The cavity size was normal. Wall  thickness was  normal. Systolic function was normal. - Right atrium: The atrium was normal in size. - Tricuspid valve: There was mild regurgitation. - Pericardium, extracardiac: The pericardium was normal in  appearance.   PHYSICAL EXAM  Temp:  [97.6 F (36.4 C)-98.4 F (36.9 C)] 98.2 F (36.8 C) (04/22 1741) Pulse Rate:  [73-97] 73 (04/22 1741) Resp:  [15-18] 18 (04/22 1741) BP: (113-128)/(56-72) 126/72 mmHg (04/22 1741) SpO2:  [95 %-99 %] 97 % (04/22 1741)  General - Well nourished, well developed, in no apparent distress.  Ophthalmologic - Sharp disc margins OU.   Cardiovascular - Regular rate and rhythm, not in afib rhythm.  Mental Status -  Level of arousal and orientation to time, place, and person were intact. Language including expression, naming, repetition, comprehension was assessed and found intact. Fund of Knowledge was assessed and was intact.  Cranial Nerves II - XII - II - Visual field intact OU. III, IV, VI - Extraocular movements intact. V - Facial sensation intact bilaterally. VII - Facial movement intact bilaterally. VIII - Hearing & vestibular intact bilaterally. X - Palate elevates symmetrically. XI - Chin turning & shoulder shrug intact bilaterally. XII - Tongue protrusion intact.  Motor Strength - The patient's strength was normal in all extremities and pronator drift was absent.  Bulk was normal and fasciculations were absent.   Motor Tone - Muscle tone was assessed at the neck and appendages and was normal.  Reflexes - The patient's reflexes were 1+ in all extremities and he had no pathological reflexes.  Sensory - Light touch, temperature/pinprick were assessed and were symmetrical, but subjective feeling of both palm tingling.    Coordination - The patient had normal movements in the hands and feet with no ataxia or dysmetria.  Tremor was absent.  Gait and Station - deferred to PT in room   ASSESSMENT/PLAN Mr. Dustin Long is a 50  y.o. male with history of ongoing tobacco use presenting with palpitations, speech difficulties, left hand weakness, and numbness of both upper extremities. found to have afib. He did not receive IV t-PA due to late presentation.  Possible right brain TIA  Resultant subjective b/l hand tingling  MRI No acute intracranial abnormality.   CTA of the head and neck - negative  MRI C spine - mild to moderate spinal cord mass effect most pronounced at C5-C6. No cord signal abnormality associated.  2D Echo - EF 65-70%. No cardiac source of emboli identified.  LDL - 148  HgbA1c 6.9  VTE prophylaxis - Xarelto  Diet Heart Room service appropriate?: Yes; Fluid consistency:: Thin  No antithrombotic prior to admission, now on Xarelto (rivaroxaban) daily.   Patient counseled to be compliant with his antithrombotic medications  Ongoing aggressive stroke risk factor management  Therapy recommendations: No follow-up therapy recommended.  Disposition: Pending  Newly diagnosed afib  Cardiology on board  Recommend Xarelto for stroke prevention  Continue Xarelto on discharge  Cervical spinal stenosis  MRI C-spine showed mild to moderate spinal cord mass effect most pronounced at C5-C6. No cord signal abnormality associated.  No surgical indication at this time  Will need outpt follow up with NSG  Also recommend outpt EMG study to evaluate radiculopathy  Consider outpt PT/OT   Hypertension  Stable  Hyperlipidemia  Home meds: No lipid lowering medications prior to admission  LDL 148, goal < 70  Now on Crestor 20 mg daily  Continue statin at discharge  Pre-diabetes  HgbA1c 6.9, goal < 7.0  Uncontrolled  outpt PCP close follow up  Tobacco abuse  Current smoker  Smoking cessation counseling provided  Pt is willing to quit  Other Stroke Risk Factors  ETOH use  Obesity, Body mass index is 29.66 kg/(m^2).   Other Active Problems    Hospital day #  1  Marvel PlanJindong Zane Pellecchia, MD PhD Stroke Neurology 09/19/2015 6:57 PM   To contact Stroke Continuity provider, please refer to [email or web address]. After hours, contact General Neurology

## 2015-09-19 NOTE — Progress Notes (Signed)
  Echocardiogram 2D Echocardiogram has been performed.  Dustin Long, Dustin Long 09/19/2015, 1:42 PM

## 2015-09-19 NOTE — Discharge Instructions (Signed)

## 2015-09-19 NOTE — Care Management Note (Signed)
Case Management Note  Patient Details  Name: Dustin Long MRN: [id number] Date of Birth: 05/07/1966  Subjective/Objective:                  CVA  Action/Plan: CM spoke with patient at the bedside. Provided patient with a Xarelto 30 day free card. Encouraged to call the telephone number in the Xarelto pamphlet on Monday to request to enroll in Patient Assistance Program. Prov[facility] and [facility] for primary care and referrals to specialists. Patient verbalizes understanding of the above and denies having any questions.   Expected Discharge Date:    09/20/15              Expected Discharge Plan:  Home/Self Care  In-House Referral:     Discharge planning Services  CM Consult, Medication Assistance, Indigent Health Clinic  Post Acute Care Choice:    Choice offered to:     DME Arranged:  N/A DME Agency:  NA  HH Arranged:  NA HH Agency:  NA  Status of Service:  Completed, signed off  Medicare Important Message Given:    Date Medicare IM Given:    Medicare IM give by:    Date Additional Medicare IM Given:    Additional Medicare Important Message give by:     If discussed at Long Length of Stay Meetings, dates discussed:    Additional Comments:  Antony HasteBennett, Divante Kotch Harris, RN 09/19/2015, 4:18 PM

## 2015-09-19 NOTE — Progress Notes (Signed)
Paged Diabetes coordinator to notify about patient's consult. Awaiting response.

## 2015-09-19 NOTE — Progress Notes (Signed)
OT Cancellation Note  Patient Details Name: Dustin Long MRN: [id number] DOB: 10/20/1965   Cancelled Treatment:    Reason Eval/Treat Not Completed: OT screened, no needs identified, will sign off.  Pt with negative MRI, and per PT, pt has returned to baseline.   Angelene GiovanniConarpe, Elianny Buxbaum M  Shaquera Ansley Miamionarpe, OTR/L [phone]  09/19/2015, 12:11 PM

## 2015-09-19 NOTE — Progress Notes (Signed)
PROGRESS NOTE  Dustin RollsMohammad Panas ZOX:[id number] DOB: 12/13/1965 DOA: 09/18/2015 PCP: No primary care provider on file.  HPI/Recap of past 24 hours:  Reported persistent left hand numbness, mri no acute stroke Denies chest pain, no sob, no palpitation Wife and daughter in room  Assessment/Plan: Principal Problem:   Stroke-like symptoms Active Problems:   Atrial fibrillation (HCC)   New onset a-fib (HCC)   Hemispheric carotid artery syndrome   Slurred speech  TIA: mri brain no cva, echo with bubble pending, a1c 6.9, ldl 148, started  On statin, xarelto per cardiology recommendations. Neurology following,   Left arm numbness and neck pain, MRI neck pending.  Paroxysmal Afib, with intermittent bradycardia, cardiology recommended xarelto for now, holter monitor at discharge and afib clinic follow up  HLD; ldl 148, started statin  Diabetes:  new diagnosis. a1c 6.9, fasting blood glucose 236 this am. Patient reported with history of elevated blood glucose was treated with acupuncture with great effect in the past, he reported his father has "old age diabetes" Diabetes education, will need testing supplies at discharge, will start metformin at discharge  Obesity: life style changes  Smoker: smoking cessation education  Care manager consulted for assistance with meds, follow up appointment, patient does not have insurance, no pmd.  Code Status: full  Family Communication: patient and wife in room  Disposition Plan: home , likely 4/23   Consultants:  Neurology  cardiology  Procedures:  none  Antibiotics:  none   Objective: BP 117/64 mmHg  Pulse 74  Temp(Src) 98.4 F (36.9 C) (Oral)  Resp 18  Ht 6\' 2"  (1.88 m)  Wt 104.831 kg (231 lb 1.8 oz)  BMI 29.66 kg/m2  SpO2 95%  Intake/Output Summary (Last 24 hours) at 09/19/15 1425 Last data filed at 09/18/15 2105  Gross per 24 hour  Intake    243 ml  Output      0 ml  Net    243 ml   Filed Weights   09/18/15  0958 09/18/15 1600  Weight: 102.059 kg (225 lb) 104.831 kg (231 lb 1.8 oz)    Exam:   General:  NAD  Cardiovascular: RRR  Respiratory: CTABL  Abdomen: Soft/ND/NT, positive BS  Musculoskeletal: No Edema  Neuro: aaox3, no focal weakness  Data Reviewed: Basic Metabolic Panel:  Recent Labs Lab 09/18/15 1013 09/18/15 1017 09/19/15 0310  NA 138 140 136  K 4.1 4.1 4.5  CL 106 103 103  CO2 23  --  21*  GLUCOSE 128* 122* 236*  BUN 18 22* 14  CREATININE 0.86 0.80 0.93  CALCIUM 9.3  --  8.8*   Liver Function Tests:  Recent Labs Lab 09/18/15 1013  AST 24  ALT 25  ALKPHOS 58  BILITOT 0.7  PROT 7.3  ALBUMIN 3.9   No results for input(s): LIPASE, AMYLASE in the last 168 hours. No results for input(s): AMMONIA in the last 168 hours. CBC:  Recent Labs Lab 09/18/15 1013 09/18/15 1017 09/19/15 0310  WBC 10.4  --  9.8  NEUTROABS 4.6  --   --   HGB 14.3 16.3 12.7*  HCT 45.2 48.0 40.7  MCV 79.9  --  80.1  PLT 179  --  194   Cardiac Enzymes:   No results for input(s): CKTOTAL, CKMB, CKMBINDEX, TROPONINI in the last 168 hours. BNP (last 3 results) No results for input(s): BNP in the last 8760 hours.  ProBNP (last 3 results) No results for input(s): PROBNP in the last 8760 hours.  CBG: No results for input(s): GLUCAP in the last 168 hours.  No results found for this or any previous visit (from the past 240 hour(s)).   Studies: Mr Sherrin Daisy Contrast  09/18/2015  CLINICAL DATA:  50 year old male with slurred speech and left upper extremity numbness. Initial encounter. EXAM: MRI HEAD WITHOUT CONTRAST TECHNIQUE: Multiplanar, multiecho pulse sequences of the brain and surrounding structures were obtained without intravenous contrast. COMPARISON:  Head CT without contrast 1055 hours today. CTA head and neck 1309 hours. FINDINGS: Study is mildly degraded by motion artifact despite repeated imaging attempts. No convincing restricted diffusion to suggest acute infarct.  Major intracranial vascular flow voids are preserved. No midline shift, mass effect, evidence of mass lesion, ventriculomegaly, extra-axial collection or acute intracranial hemorrhage. Cervicomedullary junction and pituitary are within normal limits. Patchy cerebral white matter T2 and FLAIR hyperintensity in the anterior right frontal lobe near the frontal horn. Mild periventricular T2 and FLAIR hyperintensity along the body of the right lateral ventricle (series 7, image 18). These are nonspecific. No cortical encephalomalacia or chronic cerebral blood products identified. Elsewhere gray and white matter signal is within normal limits. Mastoids are clear. Trace paranasal sinus mucosal thickening. Negative orbit and scalp soft tissues. Chronic C4-C5 disc and endplate degeneration. Normal bone marrow signal. IMPRESSION: 1.  No acute intracranial abnormality. 2. Mild for age nonspecific signal changes in the right frontal lobe white matter. Electronically Signed   By: Odessa Fleming M.D.   On: 09/18/2015 15:09    Scheduled Meds: . rivaroxaban  20 mg Oral Q supper  . rosuvastatin  20 mg Oral q1800  . sodium chloride flush  3 mL Intravenous Q12H    Continuous Infusions: . sodium chloride    . sodium chloride 1,000 mL (09/19/15 0415)     Time spent:  Thanvi Blincoe MD, PhD  [facility] Pager [phone]. If 7PM-7AM, please contact night-coverage at [email or web address], password [facility] 09/19/2015, 2:25 PM

## 2015-09-20 ENCOUNTER — Other Ambulatory Visit: Payer: Self-pay | Admitting: Neurology

## 2015-09-20 DIAGNOSIS — E119 Type 2 diabetes mellitus without complications: Secondary | ICD-10-CM

## 2015-09-20 DIAGNOSIS — R2990 Unspecified symptoms and signs involving the nervous system: Secondary | ICD-10-CM

## 2015-09-20 DIAGNOSIS — I4891 Unspecified atrial fibrillation: Secondary | ICD-10-CM

## 2015-09-20 LAB — CBC
HCT: 38 % — ABNORMAL LOW (ref 39.0–52.0)
HEMOGLOBIN: 12 g/dL — AB (ref 13.0–17.0)
MCH: 25.4 pg — ABNORMAL LOW (ref 26.0–34.0)
MCHC: 31.6 g/dL (ref 30.0–36.0)
MCV: 80.3 fL (ref 78.0–100.0)
Platelets: 164 10*3/uL (ref 150–400)
RBC: 4.73 MIL/uL (ref 4.22–5.81)
RDW: 13.6 % (ref 11.5–15.5)
WBC: 10.7 10*3/uL — AB (ref 4.0–10.5)

## 2015-09-20 LAB — BASIC METABOLIC PANEL
ANION GAP: 10 (ref 5–15)
BUN: 12 mg/dL (ref 6–20)
CHLORIDE: 104 mmol/L (ref 101–111)
CO2: 26 mmol/L (ref 22–32)
Calcium: 8.7 mg/dL — ABNORMAL LOW (ref 8.9–10.3)
Creatinine, Ser: 0.8 mg/dL (ref 0.61–1.24)
GFR calc non Af Amer: >60 mL/min (ref >60)
Glucose, Bld: 107 mg/dL — ABNORMAL HIGH (ref 65–99)
POTASSIUM: 3.9 mmol/L (ref 3.5–5.1)
SODIUM: 140 mmol/L (ref 135–145)

## 2015-09-20 LAB — MAGNESIUM: MAGNESIUM: 1.8 mg/dL (ref 1.7–2.4)

## 2015-09-20 LAB — TSH: TSH: 2.997 u[IU]/mL (ref 0.350–4.500)

## 2015-09-20 MED ORDER — BLOOD GLUCOSE MONITOR KIT: PACK | Status: AC

## 2015-09-20 MED ORDER — RIVAROXABAN 20 MG PO TABS: 20.0000 mg | ORAL_TABLET | Freq: Every day | ORAL | Status: AC

## 2015-09-20 MED ORDER — METFORMIN HCL 500 MG PO TABS: 500.0000 mg | ORAL_TABLET | Freq: Two times a day (BID) | ORAL | Status: AC

## 2015-09-20 MED ORDER — LIVING WELL WITH DIABETES BOOK
Freq: Once | Status: AC
  Administered 2015-09-20: 11:00:00
  Filled 2015-09-20: qty 1

## 2015-09-20 MED ORDER — ROSUVASTATIN CALCIUM 20 MG PO TABS: 20.0000 mg | ORAL_TABLET | Freq: Every day | ORAL | Status: AC

## 2015-09-20 NOTE — Progress Notes (Signed)
Discharge instructions received. RN discussed discharge instructions with patient including follow up appointments to cardiology r/t afib, f/u with neurosurgery for stenosis, and f/u with pcp. Discussed discharge medications including xarelto, crestor, and metformin. Patient vocalized understanding of indication, dosage, frequency, and s/sx to report to md for each medication. RN also discussed blood sugar monitoring, patient vocalized understanding of use of monitor, indication, and logging blood sugars. Patient stated he did not want to be on medications for the rest of his life, rn stressed importance of continuing medications, patient should not stop taking medications without physician instruction. Patient vocalized understanding. Neuro assessment unchanged, patient denies pain. Prescriptions and discharge instructions given to patient. Tele removed. Patient waiting for ride home.

## 2015-09-20 NOTE — Progress Notes (Signed)
RN also discussed discharge instructions with patient's wife. Patient's wife expressed concern over payments for doctor's office visits, case management was made aware. Per dr. Janeece Fittingf xu, patient to have outpatient pt/ot. Case management aware and going to educate patient/give map for outpatient pt/ot

## 2015-09-20 NOTE — Care Management (Signed)
[facility] fax[facility]. Patient provided with telephone number and map to the location. Physicist, medicalCrystal Ephraim Reichel RN BSN CCM

## 2015-09-20 NOTE — Progress Notes (Signed)
STROKE TEAM PROGRESS NOTE   SUBJECTIVE (INTERVAL HISTORY) No family members present. Dr. Roda Shutters discussed the findings from the cervical MRI. He recommended outpatient neurological follow-up with an EMG and Nerve Conduction Study as well as a referral to a neurosurgeon.   OBJECTIVE Temp:  [97.6 F (36.4 C)-98.4 F (36.9 C)] 97.6 F (36.4 C) (04/23 1610) Pulse Rate:  [67-80] 70 (04/23 9604) Cardiac Rhythm:  [-] Normal sinus rhythm (04/22 2000) Resp:  [16-20] 20 (04/23 5409) BP: (115-126)/(64-73) 115/73 mmHg (04/23 0638) SpO2:  [94 %-99 %] 95 % (04/23 0638)  CBC:  Recent Labs Lab 09/18/15 1013  09/19/15 0310 09/20/15 0545  WBC 10.4  --  9.8 10.7*  NEUTROABS 4.6  --   --   --   HGB 14.3  < > 12.7* 12.0*  HCT 45.2  < > 40.7 38.0*  MCV 79.9  --  80.1 80.3  PLT 179  --  194 164  < > = values in this interval not displayed.  Basic Metabolic Panel:   Recent Labs Lab 09/19/15 0310 09/20/15 0545  NA 136 140  K 4.5 3.9  CL 103 104  CO2 21* 26  GLUCOSE 236* 107*  BUN 14 12  CREATININE 0.93 0.80  CALCIUM 8.8* 8.7*  MG  --  1.8    Lipid Panel:     Component Value Date/Time   CHOL 220* 09/18/2015 1402   TRIG 197* 09/18/2015 1402   HDL 33* 09/18/2015 1402   CHOLHDL 6.7 09/18/2015 1402   VLDL 39 09/18/2015 1402   LDLCALC 148* 09/18/2015 1402   HgbA1c:  Lab Results  Component Value Date   HGBA1C 6.9* 09/18/2015   Urine Drug Screen: No results found for: LABOPIA, COCAINSCRNUR, LABBENZ, AMPHETMU, THCU, LABBARB    IMAGING I have personally reviewed the radiological images below and agree with the radiology interpretations.  Ct Angio Head and Neck W/cm &/or Wo Cm 09/18/2015   Negative CTA.  No major branch occlusion or stenosis.   Ct Head Wo Contrast 09/18/2015   Study within normal limits.   Mr Brain Wo Contrast 09/18/2015   1.  No acute intracranial abnormality.  2. Mild for age nonspecific signal changes in the right frontal lobe white matter.   MRI of the  cervical spine 09/19/2015 1. Fairly advanced chronic cervical disc and endplate degeneration from C4-C5 to C6-C7. Spinal stenosis at each level with mild to moderate spinal cord mass effect most pronounced at C5-C6. No cord signal abnormality associated. 2. Moderate bilateral C5 and up to moderate right C6 neural foraminal stenosis. Mild bilateral C7 foraminal stenosis.  TTE - - Left ventricle: The cavity size was normal. There was moderate  concentric hypertrophy. Systolic function was vigorous. The  estimated ejection fraction was in the range of 65% to 70%. Wall  motion was normal; there were no regional wall motion  abnormalities. Features are consistent with a pseudonormal left  ventricular filling pattern, with concomitant abnormal relaxation  and increased filling pressure (grade 2 diastolic dysfunction).  There was no evidence of elevated ventricular filling pressure by  Doppler parameters. - Aortic valve: Structurally normal valve. There was no  regurgitation. - Mitral valve: Structurally normal valve. There was no  regurgitation. - Left atrium: The atrium was mildly dilated. - Right ventricle: The cavity size was normal. Wall thickness was  normal. Systolic function was normal. - Right atrium: The atrium was normal in size. - Tricuspid valve: There was mild regurgitation. - Pericardium, extracardiac: The pericardium was  normal in  appearance.   PHYSICAL EXAM  Temp:  [97.6 F (36.4 C)-98.4 F (36.9 C)] 97.6 F (36.4 C) (04/23 96040638) Pulse Rate:  [67-80] 70 (04/23 0638) Resp:  [16-20] 20 (04/23 54090638) BP: (115-126)/(64-73) 115/73 mmHg (04/23 81190638) SpO2:  [94 %-99 %] 95 % (04/23 14780638)  General - Well nourished, well developed, in no apparent distress.  Ophthalmologic - Sharp disc margins OU.   Cardiovascular - Regular rate and rhythm, not in afib rhythm.  Mental Status -  Level of arousal and orientation to time, place, and person were intact. Language  including expression, naming, repetition, comprehension was assessed and found intact. Fund of Knowledge was assessed and was intact.  Cranial Nerves II - XII - II - Visual field intact OU. III, IV, VI - Extraocular movements intact. V - Facial sensation intact bilaterally. VII - Facial movement intact bilaterally. VIII - Hearing & vestibular intact bilaterally. X - Palate elevates symmetrically. XI - Chin turning & shoulder shrug intact bilaterally. XII - Tongue protrusion intact.  Motor Strength - The patient's strength was normal in all extremities and pronator drift was absent.  Bulk was normal and fasciculations were absent.   Motor Tone - Muscle tone was assessed at the neck and appendages and was normal.  Reflexes - The patient's reflexes were 1+ in all extremities and he had no pathological reflexes.  Sensory - Light touch, temperature/pinprick were assessed and were symmetrical, but subjective feeling of both palm tingling.    Coordination - The patient had normal movements in the hands and feet with no ataxia or dysmetria.  Tremor was absent.  Gait and Station - deferred to PT in room   ASSESSMENT/PLAN Mr. Dustin Long is a 50 y.o. male with history of ongoing tobacco use presenting with palpitations, speech difficulties, left hand weakness, and numbness of both upper extremities. found to have afib. He did not receive IV t-PA due to late presentation.  Possible right brain TIA  Resultant subjective b/l hand tingling  MRI No acute intracranial abnormality.   CTA of the head and neck - negative  MRI C spine - mild to moderate spinal cord mass effect most pronounced at C5-C6. No cord signal abnormality associated.  2D Echo - EF 65-70%. No cardiac source of emboli identified.  LDL - 148  HgbA1c 6.9  VTE prophylaxis - Xarelto Diet heart healthy/carb modified Room service appropriate?: Yes; Fluid consistency:: Thin  No antithrombotic prior to admission, now on  Xarelto (rivaroxaban) daily.   Patient counseled to be compliant with his antithrombotic medications  Ongoing aggressive stroke risk factor management  Therapy recommendations: No follow-up therapy recommended.  Disposition: Pending  Newly diagnosed afib  Cardiology on board  Recommend Xarelto for stroke prevention  Continue Xarelto on discharge  Cervical spinal stenosis  MRI C-spine showed mild to moderate spinal cord mass effect most pronounced at C5-C6. No cord signal abnormality associated.  No surgical indication at this time  Will need outpt follow up with NSG  Also recommend outpt EMG study to evaluate radiculopathy  Consider outpt PT/OT   Hypertension  Stable  Hyperlipidemia  Home meds: No lipid lowering medications prior to admission  LDL 148, goal < 70  Now on Crestor 20 mg daily  Continue statin at discharge  Pre-diabetes  HgbA1c 6.9, goal < 7.0  Uncontrolled  outpt PCP close follow up  Tobacco abuse  Current smoker  Smoking cessation counseling provided  Pt is willing to quit  Other Stroke Risk Factors  ETOH use  Obesity, Body mass index is 29.66 kg/(m^2).   Other Active Problems    Hospital day # 1  Neurology will sign off. Please call with questions. Pt will follow up with Darrol Angel NP at [facility] in about 2 months and consider EMG/NCS. Thanks for the consult.  Marvel Plan, MD PhD Stroke Neurology 09/20/2015 8:40 AM   To contact Stroke Continuity provider, please refer to [email or web address]. After hours, contact General Neurology

## 2015-09-20 NOTE — Discharge Summary (Signed)
Discharge Summary  Dustin Long HYQ:[id number] DOB: [birth year]  PCP: No primary care provider on file.  Admit date: 09/18/2015 Discharge date: 09/20/2015  Time spent: <43mns  Recommendations for Outpatient Follow-up:  1. F/u with PMD within a week  for hospital discharge follow up, repeat cbc/bmp at follow up, pmd to continue monitor blood sugar control 2. F/u with cardiology for paroxysmal afib/bradycardia, 48hr holter monitor recommended by cardiology 3. F/u with neurosurgery for cervical spinal stenosis 4. F/u with neurology for possible TIA  Discharge Diagnoses:  Active Hospital Problems   Diagnosis Date Noted  . Stroke-like symptoms 09/18/2015  . HLD (hyperlipidemia)   . Spinal stenosis in cervical region   . [facility] 09/18/2015  . [place]fib ([facility]   . Hemispheric carotid artery syndrome   . Slurred speech     Resolved Hospital Problems   Diagnosis Date Noted Date Resolved  No resolved problems to display.    Discharge Condition: stable  Diet recommendation: heart healthy/carb modified  Filed Weights   09/18/15 0958 09/18/15 1600  Weight: 102.059 kg (225 lb) 104.831 kg (231 lb 1.8 oz)    History of present illness:  Dustin Lauferis a 50y.o. male with no significant medical history who presented to ED this am with slurred speech. For a week patient has been having intermittent irregular heartbeat associated with chest discomfort . Symptoms worse with exertion . Patient complains of bilateral upper extremity numbness associated with discomfort when making a fists. Around 7 AM today patient woke with slurred speech. Currently patient is a little dizzy, the bilateral upper extremity numbness is better but unresolved. Slurred speech has resolved  ED Course: no significant lab abnormalities. No acute abnormalities on head and neck CTA. EDP contacted Neurology - Dr. NSilverio Decampwho will see the patient  Hospital Course:  Principal Problem:  Stroke-like symptoms Active Problems:   [facility]rial [facility] ([facility]   [place] [facility]   Hemispheric carotid artery syndrome   Slurred speech   HLD (hyperlipidemia)   Spinal stenosis in cervical region  TIA: mri brain no cva, echo with bubble unremarkable, detail please refer to original report, a1c 6.9, ldl 148, started On statin, xarelto per cardiology recommendations. Neurology following,   Left arm numbness and neck pain, MRI neck with cervical spinal stenosis, outpatient PT/OT referral and neurosurgery follow up.  Paroxysmal Afib, with intermittent bradycardia, cardiology recommended xarelto for now, holter monitor at discharge and afib clinic follow up  HLD; ldl 148, started statin  noninsulin dependent Diabetes:  new diagnosis. a1c 6.9, fasting blood glucose 236 this am. Patient reported with history of elevated blood glucose was treated with acupuncture with great effect in the past, he reported his father has "old age diabetes" Diabetes education,  Glucometer/testing supplies/ metformin prescribed at discharge  Obesity: life style changes  Smoker: smoking cessation education  Care manager consulted for assistance with meds, equipment needs, follow up appointment, patient does not have insurance, no pmd.  Code Status: full  Family Communication: patient and wife in room  Disposition Plan: home on 4/23   Consultants:  Neurology  cardiology  Procedures:  none  Antibiotics:  none  Discharge Exam: BP 115/73 mmHg  Pulse 70  Temp(Src) 97.6 F (36.4 C) (Oral)  Resp 20  Ht 6' 2"  (1.88 m)  Wt 104.831 kg (231 lb 1.8 oz)  BMI 29.66 kg/m2  SpO2 95%    General: NAD  Cardiovascular: RRR  Respiratory: CTABL  Abdomen: Soft/ND/NT, positive BS  Musculoskeletal:  No Edema  Neuro: aaox3, no focal weakness   Discharge Instructions You were cared for by a hospitalist during your hospital stay. If you have any questions about your discharge  medications or the care you received while you were in the hospital after you are discharged, you can call the unit and asked to speak with the hospitalist on call if the hospitalist that took care of you is not available. Once you are discharged, your primary care physician will handle any further medical issues. Please note that NO REFILLS for any discharge medications will be authorized once you are discharged, as it is imperative that you return to your primary care physician (or establish a relationship with a primary care physician if you do not have one) for your aftercare needs so that they can reassess your need for medications and monitor your lab values.      Discharge Instructions    Ambulatory referral to Occupational Therapy    Complete by:  As directed   Cervical spinal stenosis     Ambulatory referral to Physical Therapy    Complete by:  As directed   Cervical spinal stenosis,     Diet - low sodium heart healthy    Complete by:  As directed   Carb modified.     For home use only DME Glucometer    Complete by:  As directed      Increase activity slowly    Complete by:  As directed             Medication List    TAKE these medications        blood glucose meter kit and supplies Kit  Dispense based on patient and insurance preference. Use up to four times daily as directed. (FOR ICD-9 250.00, 250.01).     metFORMIN 500 MG tablet  Commonly known as:  GLUCOPHAGE  Take 1 tablet (500 mg total) by mouth 2 (two) times daily with a meal.     multivitamin with minerals tablet  Take 1 tablet by mouth daily.     rivaroxaban 20 MG Tabs tablet  Commonly known as:  XARELTO  Take 1 tablet (20 mg total) by mouth daily with supper.     rosuvastatin 20 MG tablet  Commonly known as:  CRESTOR  Take 1 tablet (20 mg total) by mouth daily at 6 PM.       No Known Allergies Follow-up Information    Follow up with Sherren Mocha, MD In 1 week.   Specialty:  Cardiology   Why:   afib   Contact information:   [address] [place] [address] [phone]       Follow up with Ophelia Charter, MD.   Specialty:  Neurosurgery   Why:  cervical spinal stenosis   Contact information:   [address] [place] [address] [place] [address] [phone]       Follow up with [facility] In 1 week.   Why:  hospital discharge follow up, repeat cbc/bmp at follow up, monitor blood sugar control   Contact information:   [address] [place] [address] [phone]       The results of significant diagnostics from this hospitalization (including imaging, microbiology, ancillary and laboratory) are listed below for reference.    Significant Diagnostic Studies: Ct Angio Head W/cm &/or Wo Cm  09/18/2015  CLINICAL DATA:  Left face numbness, slurred speech, decreased grip in left  hand. EXAM: CT ANGIOGRAPHY HEAD AND NECK TECHNIQUE: Multidetector CT imaging of the head and neck was performed using the standard protocol during bolus administration of intravenous contrast. Multiplanar CT image reconstructions and MIPs were obtained to evaluate the vascular anatomy. Carotid stenosis measurements (when applicable) are obtained utilizing NASCET criteria, using the distal internal carotid diameter as the denominator. CONTRAST:  80 cc Isovue 370 intravenous COMPARISON:  Head CT from earlier today FINDINGS: CTA NECK Aortic arch: No aneurysm or dissection.  Three vessel branching Right carotid system: No atheromatous changes. No stenosis, dissection, or vessel beading Left carotid system: No atheromatous change. No stenosis, dissection, or vessel beading. Vertebral arteries:No proximal subclavian stenosis. Codominant vertebral arteries. The right V1 segment is partially obscured by intravenous contrast. Both vertebral arteries are smooth widely patent to the dura . Skeleton: No contributory finding. Multilevel disc  degeneration with reversed cervical lordosis. Other neck: Atherosclerotic plaque seen in the distal LAD. CTA HEAD Anterior circulation: Fenestrated anterior communicating artery. Probable small posterior communicating arteries. No major branch occlusion or stenosis. No aneurysm or beading. Posterior circulation: Symmetric vertebral arteries and vertebrobasilar branching. Both PICAs originates near the dural penetration. No major branch occlusion or stenosis. No aneurysm or beading. Venous sinuses: Patent Anatomic variants: Fenestrated anterior communicating artery Delayed phase: No parenchymal enhancement or mass lesion. IMPRESSION: Negative CTA.  No major branch occlusion or stenosis. Electronically Signed   By: Monte Fantasia M.D.   On: 09/18/2015 13:52   Ct Head Wo Contrast  09/18/2015  CLINICAL DATA:  Slurred speech and left arm numbness EXAM: CT HEAD WITHOUT CONTRAST TECHNIQUE: Contiguous axial images were obtained from the base of the skull through the vertex without intravenous contrast. COMPARISON:  None. FINDINGS: The ventricles are normal in size and configuration. The right lateral ventricle is slightly larger than the left lateral ventricle, an anatomic variant. There is no intracranial mass, hemorrhage, extra-axial fluid collection, or midline shift. The gray-white compartments are normal. No acute infarct evident. The bony calvarium appears intact. The mastoid air cells are clear. No intraorbital lesions are evident. IMPRESSION: Study within normal limits. Electronically Signed   By: Lowella Grip III M.D.   On: 09/18/2015 11:03   Ct Angio Neck W/cm &/or Wo/cm  09/18/2015  CLINICAL DATA:  Left face numbness, slurred speech, decreased grip in left hand. EXAM: CT ANGIOGRAPHY HEAD AND NECK TECHNIQUE: Multidetector CT imaging of the head and neck was performed using the standard protocol during bolus administration of intravenous contrast. Multiplanar CT image reconstructions and MIPs were  obtained to evaluate the vascular anatomy. Carotid stenosis measurements (when applicable) are obtained utilizing NASCET criteria, using the distal internal carotid diameter as the denominator. CONTRAST:  80 cc Isovue 370 intravenous COMPARISON:  Head CT from earlier today FINDINGS: CTA NECK Aortic arch: No aneurysm or dissection.  Three vessel branching Right carotid system: No atheromatous changes. No stenosis, dissection, or vessel beading Left carotid system: No atheromatous change. No stenosis, dissection, or vessel beading. Vertebral arteries:No proximal subclavian stenosis. Codominant vertebral arteries. The right V1 segment is partially obscured by intravenous contrast. Both vertebral arteries are smooth widely patent to the dura . Skeleton: No contributory finding. Multilevel disc degeneration with reversed cervical lordosis. Other neck: Atherosclerotic plaque seen in the distal LAD. CTA HEAD Anterior circulation: Fenestrated anterior communicating artery. Probable small posterior communicating arteries. No major branch occlusion or stenosis. No aneurysm or beading. Posterior circulation: Symmetric vertebral arteries and vertebrobasilar branching. Both PICAs originates near the dural penetration. No major branch  occlusion or stenosis. No aneurysm or beading. Venous sinuses: Patent Anatomic variants: Fenestrated anterior communicating artery Delayed phase: No parenchymal enhancement or mass lesion. IMPRESSION: Negative CTA.  No major branch occlusion or stenosis. Electronically Signed   By: Monte Fantasia M.D.   On: 09/18/2015 13:52   Mr Brain Wo Contrast  09/18/2015  CLINICAL DATA:  50 year old male with slurred speech and left upper extremity numbness. Initial encounter. EXAM: MRI HEAD WITHOUT CONTRAST TECHNIQUE: Multiplanar, multiecho pulse sequences of the brain and surrounding structures were obtained without intravenous contrast. COMPARISON:  Head CT without contrast 1055 hours today. CTA head and  neck 1309 hours. FINDINGS: Study is mildly degraded by motion artifact despite repeated imaging attempts. No convincing restricted diffusion to suggest acute infarct. Major intracranial vascular flow voids are preserved. No midline shift, mass effect, evidence of mass lesion, ventriculomegaly, extra-axial collection or acute intracranial hemorrhage. Cervicomedullary junction and pituitary are within normal limits. Patchy cerebral white matter T2 and FLAIR hyperintensity in the anterior right frontal lobe near the frontal horn. Mild periventricular T2 and FLAIR hyperintensity along the body of the right lateral ventricle (series 7, image 18). These are nonspecific. No cortical encephalomalacia or chronic cerebral blood products identified. Elsewhere gray and white matter signal is within normal limits. Mastoids are clear. Trace paranasal sinus mucosal thickening. Negative orbit and scalp soft tissues. Chronic C4-C5 disc and endplate degeneration. Normal bone marrow signal. IMPRESSION: 1.  No acute intracranial abnormality. 2. Mild for age nonspecific signal changes in the right frontal lobe white matter. Electronically Signed   By: Genevie Ann M.D.   On: 09/18/2015 15:09   Mr Cervical Spine Wo Contrast  09/19/2015  CLINICAL DATA:  50 year old male with cervical neck pain and left arm numbness, weakness. Initial encounter. EXAM: MRI CERVICAL SPINE WITHOUT CONTRAST TECHNIQUE: Multiplanar, multisequence MR imaging of the cervical spine was performed. No intravenous contrast was administered. COMPARISON:  Brain MRI from 1436 hours today. CTA head and neck 09/18/2015. FINDINGS: Reversal of cervical lordosis as demonstrated on yesterday CTA. No marrow edema or evidence of acute osseous abnormality. Multilevel chronic disc and endplate degeneration. Gas and fluid containing posterior C5 endplate Schmorl node, series 5, image 5. Cervicomedullary junction is within normal limits. Despite multilevel cervical spinal stenosis, no  spinal cord signal abnormality is identified. Negative paraspinal soft tissues. C2-C3:  Negative. C3-C4:  Mild mostly anterior disc bulge.  No stenosis. C4-C5: Disc space loss. Right eccentric circumferential disc osteophyte complex with broad-based posterior component of disc (series 6, image 12). Mild ligament flavum hypertrophy. Spinal stenosis with mild mass effect on the spinal cord. Uncovertebral hypertrophy. Moderate bilateral C5 foraminal stenosis. C5-C6: Disc space loss with circumferential disc osteophyte complex. Broad-based posterior component (series 6, image 17). Mild ligament flavum hypertrophy. Spinal stenosis with mild to moderate spinal cord mass effect. Uncovertebral hypertrophy primarily on the right with mild to moderate right C6 foraminal stenosis. C6-C7: Disc space loss with circumferential disc osteophyte complex. Broad-based posterior component of disc (series 6, image 20). There may also be some cephalad extension of disc in the midline. Mild ligament flavum hypertrophy. Spinal stenosis with no definite spinal cord mass effect. Uncovertebral hypertrophy and disc resulting in mild bilateral C7 foraminal stenosis. C7-T1:  Mild uncovertebral and facet hypertrophy.  No stenosis. T1-T2 small central disc protrusion without stenosis. No upper thoracic spinal stenosis identified. IMPRESSION: 1. Fairly advanced chronic cervical disc and endplate degeneration from C4-C5 to C6-C7. Spinal stenosis at each level with mild to moderate spinal cord mass  effect most pronounced at C5-C6. No cord signal abnormality associated. 2. Moderate bilateral C5 and up to moderate right C6 neural foraminal stenosis. Mild bilateral C7 foraminal stenosis. Electronically Signed   By: Genevie Ann M.D.   On: 09/19/2015 15:56    Microbiology: No results found for this or any previous visit (from the past 240 hour(s)).   Labs: Basic Metabolic Panel:  Recent Labs Lab 09/18/15 1013 09/18/15 1017 09/19/15 0310  09/20/15 0545  NA 138 140 136 140  K 4.1 4.1 4.5 3.9  CL 106 103 103 104  CO2 23  --  21* 26  GLUCOSE 128* 122* 236* 107*  BUN 18 22* 14 12  CREATININE 0.86 0.80 0.93 0.80  CALCIUM 9.3  --  8.8* 8.7*  MG  --   --   --  1.8   Liver Function Tests:  Recent Labs Lab 09/18/15 1013  AST 24  ALT 25  ALKPHOS 58  BILITOT 0.7  PROT 7.3  ALBUMIN 3.9   No results for input(s): LIPASE, AMYLASE in the last 168 hours. No results for input(s): AMMONIA in the last 168 hours. CBC:  Recent Labs Lab 09/18/15 1013 09/18/15 1017 09/19/15 0310 09/20/15 0545  WBC 10.4  --  9.8 10.7*  NEUTROABS 4.6  --   --   --   HGB 14.3 16.3 12.7* 12.0*  HCT 45.2 48.0 40.7 38.0*  MCV 79.9  --  80.1 80.3  PLT 179  --  194 164   Cardiac Enzymes: No results for input(s): CKTOTAL, CKMB, CKMBINDEX, TROPONINI in the last 168 hours. BNP: BNP (last 3 results) No results for input(s): BNP in the last 8760 hours.  ProBNP (last 3 results) No results for input(s): PROBNP in the last 8760 hours.  CBG: No results for input(s): GLUCAP in the last 168 hours.     SignedFlorencia Reasons MD, PhD  [facility] 09/20/2015, 9:08 AM

## 2015-09-20 NOTE — Progress Notes (Signed)
Inpatient Diabetes Program Recommendations  AACE/ADA: New Consensus Statement on Inpatient Glycemic Control (2015)  Target Ranges:  Prepandial:   less than 140 mg/dL      Peak postprandial:   less than 180 mg/dL (1-2 hours)      Critically ill patients:  140 - 180 mg/dL   Review of Glycemic Control  Diabetes history: Pre-diabetes Outpatient Diabetes medications: None Current orders for Inpatient glycemic control: To be discharged on metformin 500 mg bid  HgbA1C - 6.2% To f/u with [facility] for glycemic control. Ordered Living Well With Diabetes book and pt to view diabetes videos on pt ed channel. MD ordered glucose meter and supplies at discharge.  Spoke with RN regarding pt viewing diabetes videos. RN states pt is motivated to control blood sugars at home. [facility]  Thank you. Dustin Long, RD, LDN, CDE Inpatient Diabetes Coordinator [phone]

## 2015-09-21 ENCOUNTER — Other Ambulatory Visit: Payer: Self-pay | Admitting: Cardiology

## 2015-09-21 DIAGNOSIS — G459 Transient cerebral ischemic attack, unspecified: Secondary | ICD-10-CM

## 2015-09-30 ENCOUNTER — Inpatient Hospital Stay (HOSPITAL_COMMUNITY): Admission: RE | Admit: 2015-09-30 | Payer: Self-pay | Source: Ambulatory Visit | Admitting: Nurse Practitioner

## 2015-12-15 ENCOUNTER — Ambulatory Visit: Payer: Self-pay | Admitting: Nurse Practitioner

## 2017-06-01 IMAGING — MR MR HEAD W/O CM
9 of 10 series · 34 of 48 positions shown · non-contrast
Comparison: Head CT without contrast 8288 hours today. CTA head and
neck 7944 hours.

CLINICAL DATA: 50-year-old male with slurred speech and left upper
extremity numbness. Initial encounter.

EXAM:
MRI HEAD WITHOUT CONTRAST
TECHNIQUE: Multiplanar, multiecho pulse sequences of the brain and surrounding
structures were obtained without intravenous contrast.

[Series 2: FLAIR · sagittal · 5.0mm · 0.47mm/px · 2 of 23 slices shown (1 of 2)]
[im 1/23]
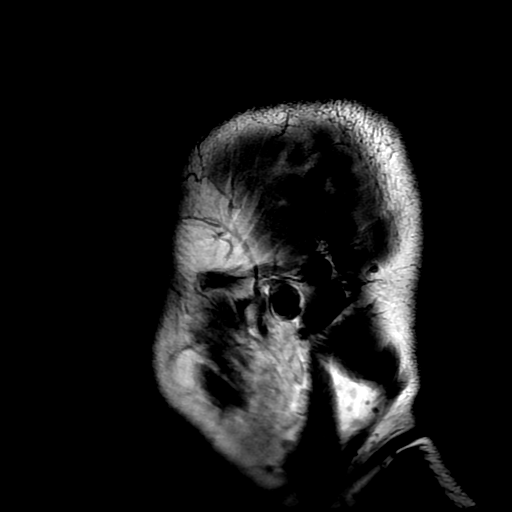
[im 23/23]
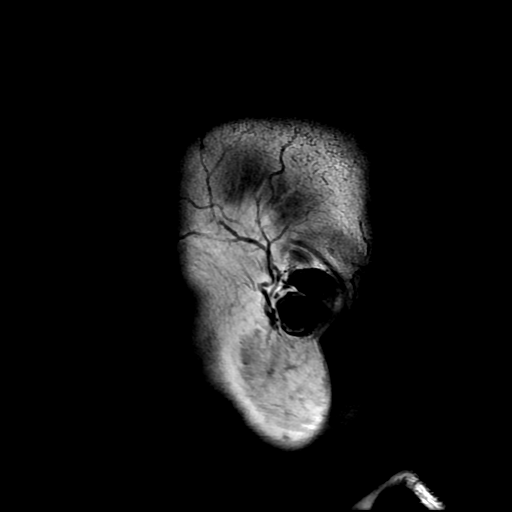

[Series 4: DWI · axial · 3.6mm · 0.94mm/px · z∈[-87,+63]mm · 8 of 86 slices shown (1 of 4)]
[im 1/86]
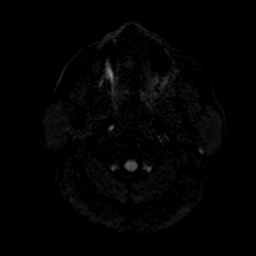
[im 13/86]
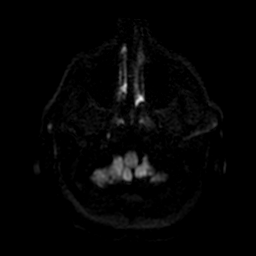
[im 25/86]
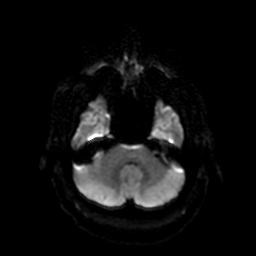
[im 37/86]
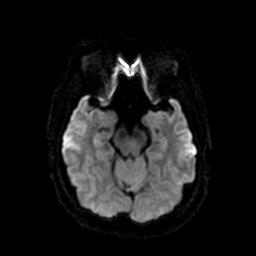
[im 49/86]
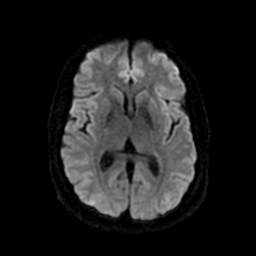
[im 61/86]
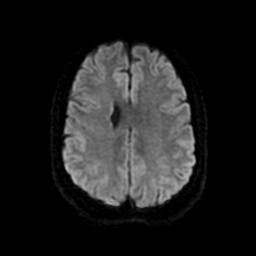
[im 73/86]
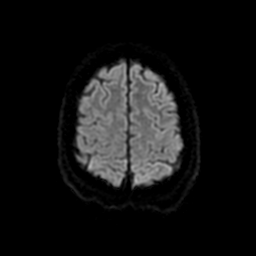
[im 86/86]
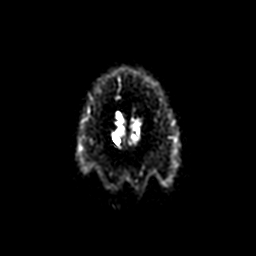

[Series 5: DWI · coronal · 5.0mm · 0.94mm/px · 7 of 68 slices shown (2 of 4)]
[im 1/68]
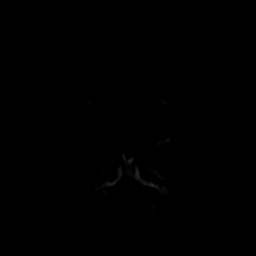
[im 12/68]
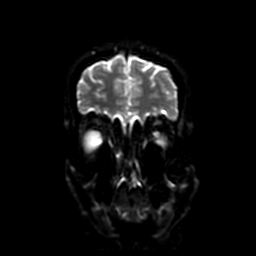
[im 23/68]
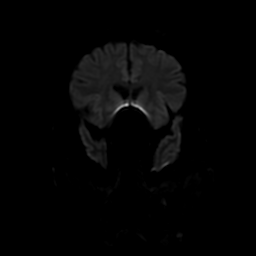
[im 34/68]
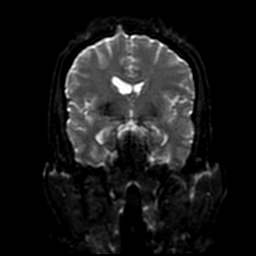
[im 45/68]
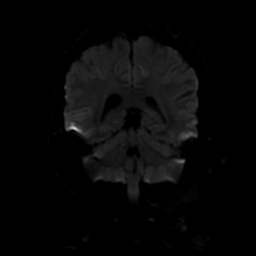
[im 56/68]
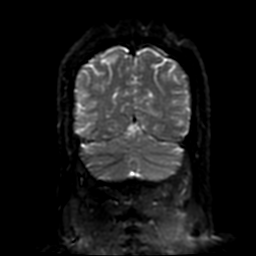
[im 68/68]
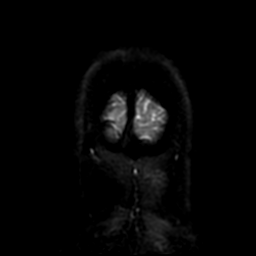

[Series 7: FLAIR · axial · 5.0mm · 0.47mm/px · z∈[-86,+63]mm · 3 of 26 slices shown (2 of 2)]
[im 1/26]
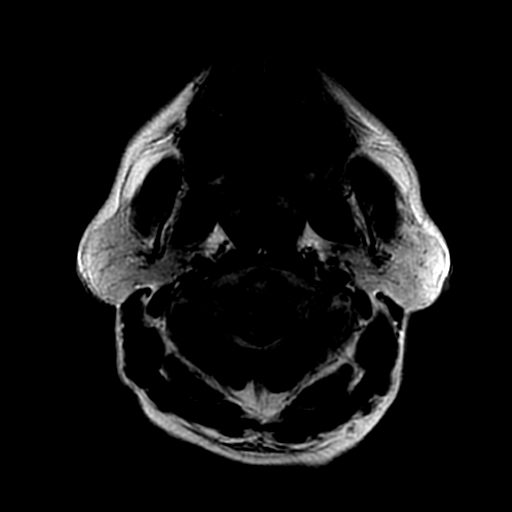
[im 13/26]
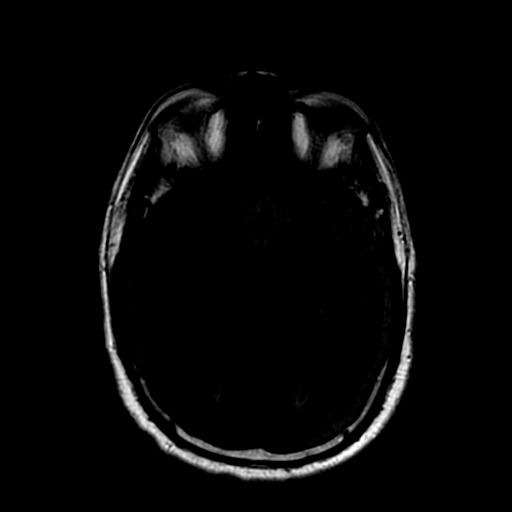
[im 26/26]
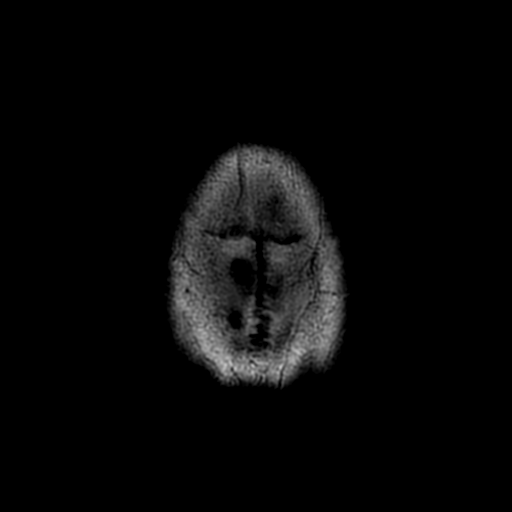

[Series 8: (person_name) · axial · 3.0mm · 0.47mm/px · 1 of 104 slices shown]
[im 1/104]
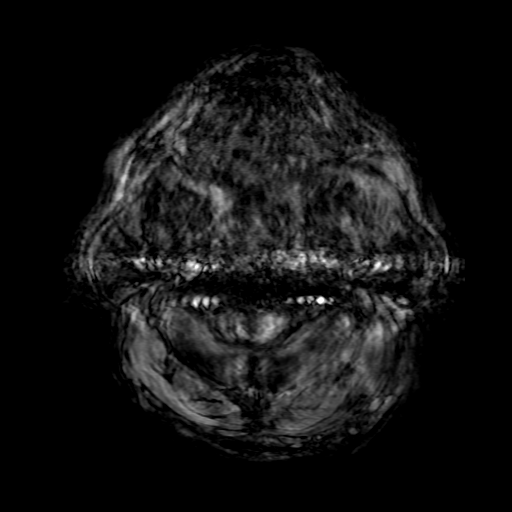

[Series 10: T2 · axial · 5.0mm · 0.47mm/px · z∈[-86,+63]mm · 3 of 26 slices shown (1 of 2)]
[im 1/26]
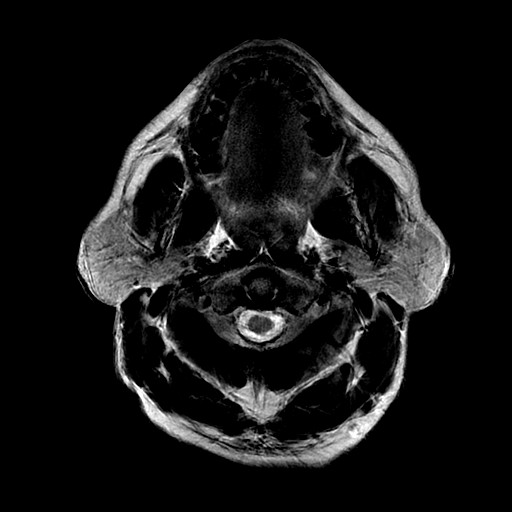
[im 13/26]
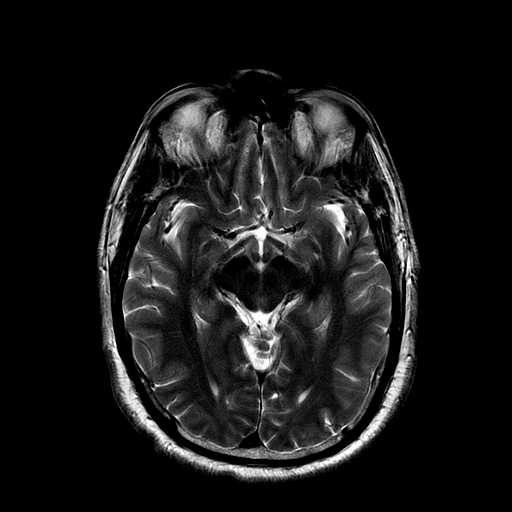
[im 26/26]
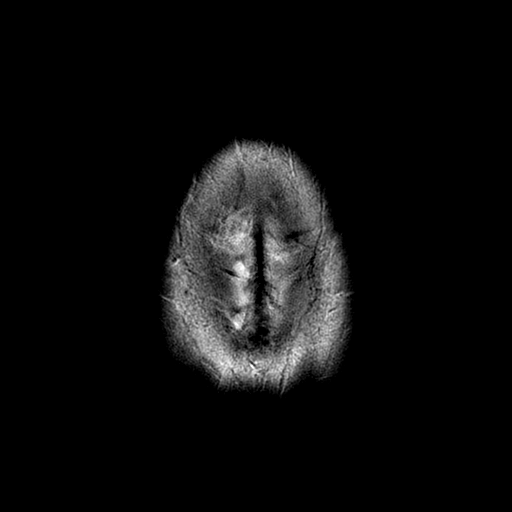

[Series 11: T2 · coronal · 5.0mm · 0.39mm/px · 3 of 29 slices shown (2 of 2)]
[im 1/29]
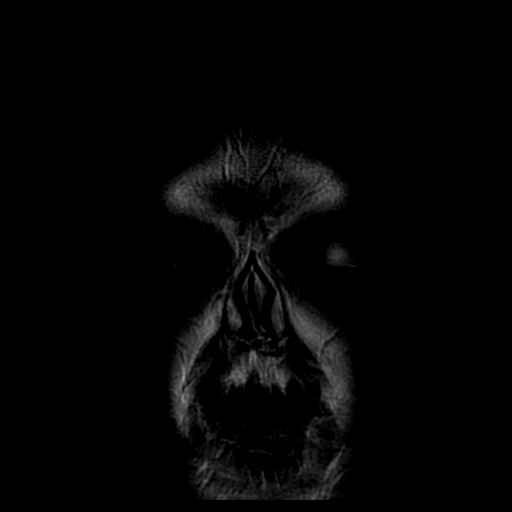
[im 15/29]
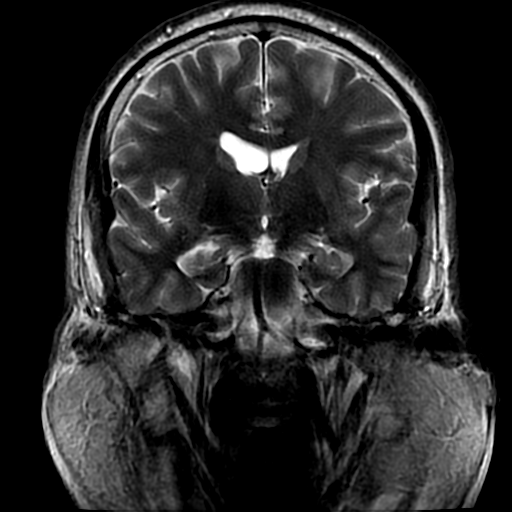
[im 29/29]
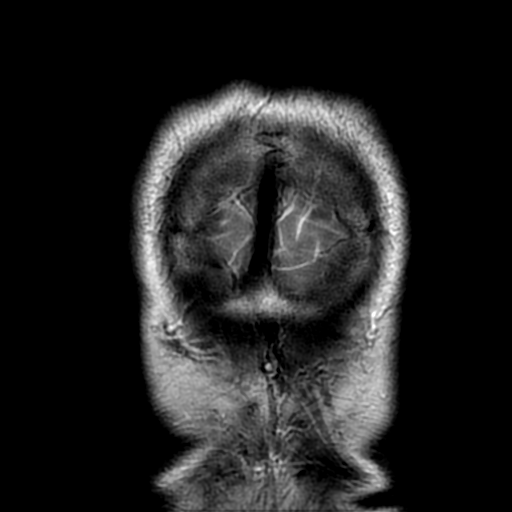

[Series 400: DWI · axial · 3.6mm · 0.94mm/px · z∈[-83,+63]mm · 4 of 42 slices shown (3 of 4)]
[im 1/42]
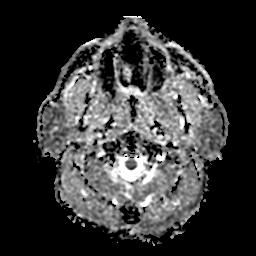
[im 14/42]
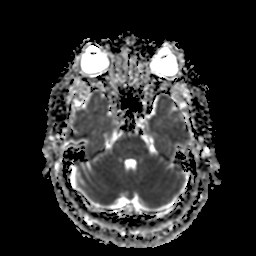
[im 28/42]
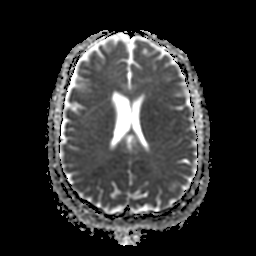
[im 42/42]
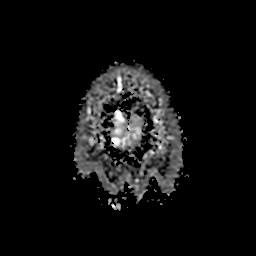

[Series 500: DWI · coronal · 5.0mm · 0.94mm/px · 3 of 33 slices shown (4 of 4)]
[im 1/33]
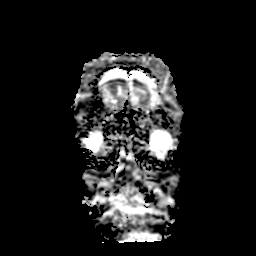
[im 17/33]
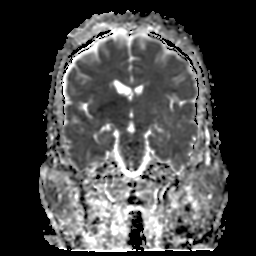
[im 33/33]
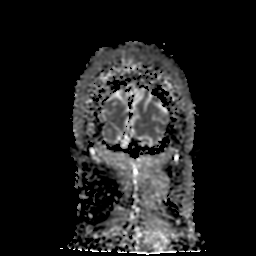

[34 of 48 positions shown; findings below may reference images not displayed]

FINDINGS: Study is mildly degraded by motion artifact despite repeated imaging
attempts.

No convincing restricted diffusion to suggest acute infarct. Major
intracranial vascular flow voids are preserved.

No midline shift, mass effect, evidence of mass lesion,
ventriculomegaly, extra-axial collection or acute intracranial
hemorrhage. Cervicomedullary junction and pituitary are within
normal limits. Patchy cerebral white matter T2 and FLAIR
hyperintensity in the anterior right frontal lobe near the frontal
horn. Mild periventricular T2 and FLAIR hyperintensity along the
body of the right lateral ventricle (series 7, image 18). These are
nonspecific. No cortical encephalomalacia or chronic cerebral blood
products identified. Elsewhere gray and white matter signal is
within normal limits.

Mastoids are clear. Trace paranasal sinus mucosal thickening.
Negative orbit and scalp soft tissues. Chronic C4-C5 disc and
endplate degeneration. Normal bone marrow signal.
IMPRESSION: 1.  No acute intracranial abnormality.
2. Mild for age nonspecific signal changes in the right frontal lobe
white matter.

## 2022-04-12 ENCOUNTER — Other Ambulatory Visit: Payer: Self-pay | Admitting: Student in an Organized Health Care Education/Training Program

## 2022-04-12 DIAGNOSIS — M5412 Radiculopathy, cervical region: Secondary | ICD-10-CM

## 2022-04-29 ENCOUNTER — Ambulatory Visit
Admission: RE | Admit: 2022-04-29 | Discharge: 2022-04-29 | Disposition: A | Discharge status: Home / Self Care | Payer: Commercial Managed Care - HMO | Source: Ambulatory Visit | Attending: Student in an Organized Health Care Education/Training Program | Admitting: Student in an Organized Health Care Education/Training Program

## 2022-04-29 DIAGNOSIS — M5412 Radiculopathy, cervical region: Secondary | ICD-10-CM

## 2023-03-16 ENCOUNTER — Ambulatory Visit: Payer: Self-pay

## 2023-03-16 ENCOUNTER — Ambulatory Visit
Admission: EM | Admit: 2023-03-16 | Discharge: 2023-03-16 | Disposition: A | Discharge status: Home / Self Care | Payer: Commercial Managed Care - HMO | Attending: Internal Medicine | Admitting: Internal Medicine

## 2023-03-16 DIAGNOSIS — S71152A Open bite, left thigh, initial encounter: Secondary | ICD-10-CM

## 2023-03-16 DIAGNOSIS — M79652 Pain in left thigh: Secondary | ICD-10-CM

## 2023-03-16 DIAGNOSIS — Z23 Encounter for immunization: Secondary | ICD-10-CM

## 2023-03-16 DIAGNOSIS — S51052A Open bite, left elbow, initial encounter: Secondary | ICD-10-CM

## 2023-03-16 DIAGNOSIS — W540XXA Bitten by dog, initial encounter: Secondary | ICD-10-CM

## 2023-03-16 DIAGNOSIS — M25522 Pain in left elbow: Secondary | ICD-10-CM

## 2023-03-16 HISTORY — DX: Unspecified osteoarthritis, unspecified site: M19.90

## 2023-03-16 HISTORY — DX: Type 2 diabetes mellitus without complications: E11.9

## 2023-03-16 MED ORDER — AMOXICILLIN-POT CLAVULANATE 875-125 MG PO TABS
1.0000 | ORAL_TABLET | Freq: Two times a day (BID) | ORAL | 0 refills | Status: AC
Start: 2023-03-16 — End: ?

## 2023-03-16 MED ORDER — TETANUS-DIPHTH-ACELL PERTUSSIS 5-2.5-18.5 LF-MCG/0.5 IM SUSY
0.5000 mL | PREFILLED_SYRINGE | Freq: Once | INTRAMUSCULAR | Status: AC
  Administered 2023-03-16: 0.5 mL via INTRAMUSCULAR

## 2023-03-16 NOTE — ED Triage Notes (Signed)
Pt reports he has a dog bite in the left upper leg and left elbow x 1 day. States he felt like he had fever 1 hr after the bite, he think it was a phycological fear, thats why he had that reaction. Reports dog has all the vaccines up to date. Pt clean with alcohol, used Neosporin. Pt was prescribed cephalexin yesterday, he has not stated yet.

## 2023-03-16 NOTE — ED Provider Notes (Signed)
[facility]  Note:  This document was prepared using Conservation officer, historic buildings and may include unintentional dictation errors.  MRN: [id number] DOB: May 16, 1966  Subjective:   Dustin Long is a 57 y.o. male presenting for 1 day history of left thigh pain, left elbow pain. Suffered a dog bite to the areas. It was not aggressive, dog was having a seizure. Vaccinations are confirmed to be up to date. Was prescribed cephalexin yesterday but has not started it.   No current facility-administered medications for this encounter.  Current Outpatient Medications:    cyclobenzaprine (FLEXERIL) 5 MG tablet, Take by mouth., Disp: , Rfl:    empagliflozin (JARDIANCE) 25 MG TABS tablet, Take 1 tablet by mouth daily., Disp: , Rfl:    naproxen (NAPROSYN) 500 MG tablet, Take by mouth., Disp: , Rfl:    omeprazole (PRILOSEC) 40 MG capsule, Take 1 capsule by mouth daily., Disp: , Rfl:    blood glucose meter kit and supplies KIT, Dispense based on patient and insurance preference. Use up to four times daily as directed. (FOR ICD-9 250.00, 250.01)., Disp: 1 each, Rfl: 0   JANUMET XR 50-1000 MG TB24, Take 2 tablets by mouth daily., Disp: , Rfl:    metFORMIN (GLUCOPHAGE) 500 MG tablet, Take 1 tablet (500 mg total) by mouth 2 (two) times daily with a meal., Disp: 60 tablet, Rfl: 0   Multiple Vitamins-Minerals (MULTIVITAMIN WITH MINERALS) tablet, Take 1 tablet by mouth daily., Disp: , Rfl:    rivaroxaban (XARELTO) 20 MG TABS tablet, Take 1 tablet (20 mg total) by mouth daily with supper., Disp: 30 tablet, Rfl: 0   rosuvastatin (CRESTOR) 20 MG tablet, Take 1 tablet (20 mg total) by mouth daily at 6 PM., Disp: 30 tablet, Rfl: 0   No Known Allergies  Past Medical History:  Diagnosis Date   Arthritis    Atrial fibrillation (HCC) 09/18/2015   Diabetes mellitus without complication (HCC)      History reviewed. No pertinent surgical history.  Family History  Problem Relation Age of  Onset   Heart attack Neg Hx    Coronary artery disease Neg Hx     Social History   Tobacco Use   Smoking status: Every Day    Current packs/day: 0.25    Types: Cigarettes  Vaping Use   Vaping status: Never Used  Substance Use Topics   Alcohol use: Yes    Comment: occ   Drug use: No    ROS   Objective:   Vitals: BP (!) 145/87 (BP Location: Right Arm)   Pulse 92   Temp 98.2 F (36.8 C) (Oral)   Resp 18   SpO2 97%   Physical Exam Constitutional:      General: He is not in acute distress.    Appearance: Normal appearance. He is well-developed and normal weight. He is not ill-appearing, toxic-appearing or diaphoretic.  HENT:     Head: Normocephalic and atraumatic.     Right Ear: External ear normal.     Left Ear: External ear normal.     Nose: Nose normal.     Mouth/Throat:     Pharynx: Oropharynx is clear.  Eyes:     General: No scleral icterus.       Right eye: No discharge.        Left eye: No discharge.     Extraocular Movements: Extraocular movements intact.  Cardiovascular:     Rate and Rhythm: Normal rate.  Pulmonary:  Effort: Pulmonary effort is normal.  Musculoskeletal:     Cervical back: Normal range of motion.  Skin:      Neurological:     Mental Status: He is alert and oriented to person, place, and time.  Psychiatric:        Mood and Affect: Mood normal.        Behavior: Behavior normal.        Thought Content: Thought content normal.        Judgment: Judgment normal.         Assessment and Plan :   PDMP not reviewed this encounter.  1. Left thigh pain   2. Dog bite of left thigh, initial encounter   3. Dog bite of left elbow, initial encounter   4. Left elbow pain   5. Need for diphtheria-tetanus-pertussis (Tdap) vaccine     Start antibiotic prophylaxis with Augmentin to cover for pasteurella. Discussed wound care. Tdap updated in clinic. Counseled patient on potential for adverse effects with medications  prescribed/recommended today, ER and return-to-clinic precautions discussed, patient verbalized understanding.    Wallis Bamberg, PA-C 03/16/23 1426

## 2023-03-16 NOTE — Discharge Instructions (Signed)
Start amoxicillin-clavulanate for the infection from the dog bite. Watch for worsening pain, more swelling, redness, drainage of pus, fevers; these are signs of worsening infection. If this develops go to the emergency room.
